# Patient Record
Sex: Female | Born: 1995 | Race: White | Hispanic: No | Marital: Single | Smoking: Former smoker
Health system: Southern US, Community
[De-identification: ages and names within clinical notes are randomized; demographics above are authoritative.]

## PROBLEM LIST (undated history)

## (undated) DIAGNOSIS — R55 Syncope and collapse: Secondary | ICD-10-CM

## (undated) DIAGNOSIS — R51 Headache: Secondary | ICD-10-CM

## (undated) DIAGNOSIS — Z309 Encounter for contraceptive management, unspecified: Secondary | ICD-10-CM

## (undated) DIAGNOSIS — IMO0002 Reserved for concepts with insufficient information to code with codable children: Secondary | ICD-10-CM

## (undated) DIAGNOSIS — Z3490 Encounter for supervision of normal pregnancy, unspecified, unspecified trimester: Secondary | ICD-10-CM

## (undated) DIAGNOSIS — G589 Mononeuropathy, unspecified: Secondary | ICD-10-CM

## (undated) DIAGNOSIS — I671 Cerebral aneurysm, nonruptured: Secondary | ICD-10-CM

## (undated) HISTORY — DX: Mononeuropathy, unspecified: G58.9

## (undated) HISTORY — DX: Encounter for contraceptive management, unspecified: Z30.9

## (undated) HISTORY — DX: Headache: R51

## (undated) HISTORY — PX: NO PAST SURGERIES: SHX2092

## (undated) HISTORY — DX: Encounter for supervision of normal pregnancy, unspecified, unspecified trimester: Z34.90

---
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
## 2014-03-22 ENCOUNTER — Encounter: Payer: Self-pay | Admitting: Women's Health

## 2014-03-22 ENCOUNTER — Ambulatory Visit (INDEPENDENT_AMBULATORY_CARE_PROVIDER_SITE_OTHER): Payer: Medicaid Other | Admitting: Women's Health

## 2014-03-22 VITALS — BP 112/64 | Ht 64.0 in | Wt 108.2 lb

## 2014-03-22 DIAGNOSIS — F17200 Nicotine dependence, unspecified, uncomplicated: Secondary | ICD-10-CM | POA: Insufficient documentation

## 2014-03-22 DIAGNOSIS — Z01419 Encounter for gynecological examination (general) (routine) without abnormal findings: Secondary | ICD-10-CM

## 2014-03-22 DIAGNOSIS — Z113 Encounter for screening for infections with a predominantly sexual mode of transmission: Secondary | ICD-10-CM

## 2014-03-22 DIAGNOSIS — R55 Syncope and collapse: Secondary | ICD-10-CM | POA: Insufficient documentation

## 2014-03-22 DIAGNOSIS — IMO0002 Reserved for concepts with insufficient information to code with codable children: Secondary | ICD-10-CM

## 2014-03-22 DIAGNOSIS — G935 Compression of brain: Secondary | ICD-10-CM | POA: Insufficient documentation

## 2014-03-22 DIAGNOSIS — I729 Aneurysm of unspecified site: Secondary | ICD-10-CM | POA: Insufficient documentation

## 2014-03-22 NOTE — Progress Notes (Signed)
Patient ID: Rachael Espinoza, female   DOB: 11/03/1996, 18 y.o.   MRN: [id number] Subjective:   Rachael CaraChelsea L Ungar is a 18 y.o. 761P1001 Caucasian female here for a routine well-woman exam.  Patient's last menstrual period was 03/08/2014.   H/O chiari malformation and carotid artery aneurysm, followed by [facility]- last seen Jan 2015, next visit Jan 2016 w/ possible surgery. Also has h/o vasovagal syncope, states she hasn't passed out in 5749yrs.  Current complaints: Nexplanon placed April 2014, has had amenorrhea until March, bled x 2 wks- reassured  PCP: Dr. Gerda DissLuking       Does desire STI labs  Social History: Sexual: heterosexual, mutually monogamous x 8243yrs Marital Status: dating Living situation: with family Occupation: [employer], Clinical biochemistcustomer service Tobacco/alcohol: tobacco: 1/4ppd, no etoh Illicit drugs: no history of illicit drug use  The following portions of the patient's history were reviewed and updated as appropriate: allergies, current medications, past family history, past medical history, past social history, past surgical history and problem list.  Past Medical History Past Medical History  Diagnosis Date  . Chiari malformation   . Vasovagal syncope   . Intracranial aneurysm   . NWGNFAOZ(308.6Headache(784.0)     Past Surgical History Past Surgical History  Procedure Laterality Date  . No past surgeries      Gynecologic History G1P1001  Patient's last menstrual period was 03/08/2014. Contraception: Nexplanon Last Pap: n/a. Results were: n/a Last mammogram: never. Results were: n/a Last TCS: never  Obstetric History OB History  Gravida Para Term Preterm AB SAB TAB Ectopic Multiple Living  1 1 1       1     # Outcome Date GA Lbr Len/2nd Weight Sex Delivery Anes PTL Lv  1 TRM 01/05/13 2727w4d 07:28 / 00:27 7 lb 14.3 oz (3.58 kg) F SVD None  Y      Current Medications Current Outpatient Prescriptions on File Prior to Visit  Medication Sig Dispense Refill  . ibuprofen (ADVIL,MOTRIN)  200 MG tablet Take 200 mg by mouth every 6 (six) hours as needed.       No current facility-administered medications on file prior to visit.    Review of Systems Patient denies any headaches, blurred vision, shortness of breath, chest pain, abdominal pain, problems with bowel movements, urination, or intercourse.  Objective:  BP 112/64  Ht 5\' 4"  (1.626 m)  Wt 108 lb 3.2 oz (49.079 kg)  BMI 18.56 kg/m2  LMP 03/08/2014  Breastfeeding? No Physical Exam  General:  Well developed, well nourished, no acute distress. She is alert and oriented x3. Skin:  Warm and dry Neck:  Midline trachea, no thyromegaly or nodules Cardiovascular: Regular rate and rhythm, no murmur heard Lungs:  Effort normal, all lung fields clear to auscultation bilaterally Breasts:  No dominant palpable mass, retraction, or nipple discharge Abdomen:  Soft, non tender, no hepatosplenomegaly or masses Pelvic:  Thin prep pap is not done <21yo  Extremities:  No swelling or varicosities noted Psych:  She has a normal mood and affect  Assessment:  Healthy well-woman exam STI screen Smoker Plan:  GC/CH from urine, HIV, RPR, Hep B&C, HSV2 today F/U 7130yr for Nexplanon removal and physical, or sooner if needed Mammogram @19yo  or sooner if problems Colonoscopy @19yo  or sooner if problems Pap @ 19yo Advised smoking cessation, offered QuitlineNC, declined  American FinancialKimberly Randall Danaysha Kirn CNM, [facility]NP-BC 03/22/2014 11:34 AM

## 2014-03-23 LAB — GC/CHLAMYDIA PROBE AMP
CT Probe RNA: NEGATIVE
GC PROBE AMP APTIMA: NEGATIVE

## 2014-03-23 LAB — RPR

## 2014-03-23 LAB — HEPATITIS C ANTIBODY: HCV AB: NEGATIVE

## 2014-03-23 LAB — HEPATITIS B SURFACE ANTIGEN: Hepatitis B Surface Ag: NEGATIVE

## 2014-03-23 LAB — HSV 2 ANTIBODY, IGG

## 2014-03-23 LAB — HIV ANTIBODY (ROUTINE TESTING W REFLEX): HIV 1&2 Ab, 4th Generation: NONREACTIVE

## 2014-08-25 ENCOUNTER — Encounter: Payer: Self-pay | Admitting: Nurse Practitioner

## 2014-08-25 ENCOUNTER — Ambulatory Visit (HOSPITAL_COMMUNITY)
Admission: RE | Admit: 2014-08-25 | Discharge: 2014-08-25 | Disposition: A | Discharge status: Home / Self Care | Payer: PRIVATE HEALTH INSURANCE | Source: Ambulatory Visit | Attending: Nurse Practitioner | Admitting: Nurse Practitioner

## 2014-08-25 ENCOUNTER — Encounter: Payer: Self-pay | Admitting: Adult Health

## 2014-08-25 ENCOUNTER — Ambulatory Visit (INDEPENDENT_AMBULATORY_CARE_PROVIDER_SITE_OTHER): Payer: PRIVATE HEALTH INSURANCE | Admitting: Adult Health

## 2014-08-25 ENCOUNTER — Ambulatory Visit (INDEPENDENT_AMBULATORY_CARE_PROVIDER_SITE_OTHER): Payer: PRIVATE HEALTH INSURANCE | Admitting: Nurse Practitioner

## 2014-08-25 VITALS — BP 108/74 | Ht 63.0 in | Wt 114.5 lb

## 2014-08-25 VITALS — BP 112/78 | Ht 64.0 in | Wt 114.4 lb

## 2014-08-25 DIAGNOSIS — R05 Cough: Secondary | ICD-10-CM | POA: Insufficient documentation

## 2014-08-25 DIAGNOSIS — R053 Chronic cough: Secondary | ICD-10-CM

## 2014-08-25 DIAGNOSIS — Z3202 Encounter for pregnancy test, result negative: Secondary | ICD-10-CM

## 2014-08-25 DIAGNOSIS — Z308 Encounter for other contraceptive management: Secondary | ICD-10-CM

## 2014-08-25 DIAGNOSIS — J309 Allergic rhinitis, unspecified: Secondary | ICD-10-CM

## 2014-08-25 DIAGNOSIS — Z309 Encounter for contraceptive management, unspecified: Secondary | ICD-10-CM

## 2014-08-25 DIAGNOSIS — Z77120 Contact with and (suspected) exposure to mold (toxic): Secondary | ICD-10-CM

## 2014-08-25 DIAGNOSIS — Z3046 Encounter for surveillance of implantable subdermal contraceptive: Secondary | ICD-10-CM

## 2014-08-25 DIAGNOSIS — Z30011 Encounter for initial prescription of contraceptive pills: Secondary | ICD-10-CM

## 2014-08-25 DIAGNOSIS — H1013 Acute atopic conjunctivitis, bilateral: Secondary | ICD-10-CM

## 2014-08-25 HISTORY — DX: Encounter for contraceptive management, unspecified: Z30.9

## 2014-08-25 LAB — POCT URINE PREGNANCY: Preg Test, Ur: NEGATIVE

## 2014-08-25 MED ORDER — NORETHINDRONE 0.35 MG PO TABS: 1.0000 | ORAL_TABLET | Freq: Every day | ORAL | Status: DC

## 2014-08-25 NOTE — Patient Instructions (Signed)
OTC antihistamine Nasacort AQ as directed Zaditor eye drops

## 2014-08-25 NOTE — Patient Instructions (Signed)
Use condoms, keep clean and dry x 24 hours, no heavy lifting, keep steri strips on x 72 hours, Keep pressure dressing on x 24 hours. Follow up prn problems.can start micronor now.follow up in 3 months

## 2014-08-25 NOTE — Progress Notes (Signed)
Subjective:     Patient ID: Rachael Espinoza, female   DOB: 12/21/1995, 18 y.o.   MRN: [id number]  HPI Rachael Espinoza is a 18 year old white female in for nexplanon removal, has had bleeding since March and wants it out.Has chiari malformation and aneurysm with vasovagal syncope.  Review of Systems See HPI Reviewed past medical,surgical, social and family history. Reviewed medications and allergies.     Objective:   Physical Exam BP 108/74  Ht 5\' 3"  (1.6 m)  Wt 114 lb 8 oz (51.937 kg)  BMI 20.29 kg/m2  LMP 08/15/2014  Breastfeeding? NoUPT negative verbal consent obtained, time out called,left arm cleansed with betadine, and injected with 1.5 cc 1% lidocaine and waited til numb.Under sterile technique a #11 blade was used to make small vertical incision, and a curved forceps was used to easily remove rod. Steri strips applied. Pressure dressing applied.She wanted nuva ring but only use POP with her problems.    Assessment:     Nexplanon removal Contraceptive management    Plan:     Rx micronor disp 1 pack take 1 daily with 11 refill Follow up in 3 months  Use condoms, keep clean and dry x 24 hours, no heavy lifting, keep steri strips on x 72 hours, Keep pressure dressing on x 24 hours. Follow up prn problems.   Try to stop smoking

## 2014-08-26 ENCOUNTER — Encounter: Payer: Self-pay | Admitting: Nurse Practitioner

## 2014-08-26 LAB — GC/CHLAMYDIA PROBE AMP
CT PROBE, AMP APTIMA: NEGATIVE
GC Probe RNA: NEGATIVE

## 2014-08-26 NOTE — Progress Notes (Signed)
Subjective:  Presents to discuss exposure to black mold at her workplace. Began working in this environment from January 2015 until last week. According to patient her employer was aware of issue for several years. Closet at work had visible black mold. Employer cleaned off the area and called it "mildew". Mold would grow back. Also at a vent, there was about 6 feet of leakage of water with mold growing in this area. Health department had visited the site several times. Closed down the area last week. Patient's headaches had been completely resolved but came back in April. Has developed a daily cough which is occasionally productive. Runny nose. Wheezing at times. Slight head congestion. Frequent watery eyes. Occasional sore throat. No ear pain. No fevers. Denies acid reflux or abdominal pain. History of smoking one pack per week but has stopped this and switched to evapes.  Objective:   BP 112/78  Ht 5\' 4"  (1.626 m)  Wt 114 lb 6.4 oz (51.891 kg)  BMI 19.63 kg/m2  LMP 08/15/2014 NAD. Alert, oriented. Conjunctiva clear. TMs mild clear effusion, no erythema. Nasal mucosa pink and mildly boggy. Pharynx injected with clear PND noted. Neck supple with mild soft anterior adenopathy. Lungs clear. Heart regular rate rhythm.  Assessment: Allergic rhinitis, unspecified allergic rhinitis type  Allergic conjunctivitis, bilateral  Persistent cough - Plan: DG Chest 2 View  Mold exposure  Plan: Chest x-ray pending. OTC meds as directed. OTC antihistamine Nasacort AQ as directed Zaditor eye drops Call back in 2 weeks if symptoms persist, recommend referral to allergy specialist for evaluation at that time.

## 2014-09-12 ENCOUNTER — Encounter: Payer: Self-pay | Admitting: Nurse Practitioner

## 2014-11-25 ENCOUNTER — Ambulatory Visit (INDEPENDENT_AMBULATORY_CARE_PROVIDER_SITE_OTHER): Payer: PRIVATE HEALTH INSURANCE | Admitting: Adult Health

## 2014-11-25 ENCOUNTER — Encounter: Payer: Self-pay | Admitting: Adult Health

## 2014-11-25 VITALS — BP 110/60 | Ht 63.0 in | Wt 112.5 lb

## 2014-11-25 DIAGNOSIS — Z3041 Encounter for surveillance of contraceptive pills: Secondary | ICD-10-CM

## 2014-11-25 NOTE — Progress Notes (Signed)
Subjective:     Patient ID: Rachael Espinoza, female   DOB: 03/05/1996, 19 y.o.   MRN: [id number]  HPI Rachael Espinoza is a 19 year old white female in for follow up of starting micronor in October after nexplanon removal, had ?BTB once, no complaints.  Review of Systems See HPI Reviewed past medical,surgical, social and family history. Reviewed medications and allergies.     Objective:   Physical Exam BP 110/60 mmHg  Ht 5\' 3"  (1.6 m)  Wt 112 lb 8 oz (51.03 kg)  BMI 19.93 kg/m2  LMP 11/11/2014  Breastfeeding? No   Talk only doing good with micronor  Assessment:     Contraceptive management    Plan:     Continue micronor Use condoms Follow up in October

## 2014-11-25 NOTE — Patient Instructions (Signed)
Continue OCs Use condoms Follow up in October

## 2015-01-30 ENCOUNTER — Other Ambulatory Visit: Payer: Self-pay | Admitting: Neurosurgery

## 2015-01-30 DIAGNOSIS — G935 Compression of brain: Secondary | ICD-10-CM

## 2015-02-10 ENCOUNTER — Ambulatory Visit
Admission: RE | Admit: 2015-02-10 | Discharge: 2015-02-10 | Disposition: A | Discharge status: Home / Self Care | Payer: PRIVATE HEALTH INSURANCE | Source: Ambulatory Visit | Attending: Neurosurgery | Admitting: Neurosurgery

## 2015-02-10 DIAGNOSIS — G935 Compression of brain: Secondary | ICD-10-CM

## 2015-02-10 MED ORDER — GADOBENATE DIMEGLUMINE 529 MG/ML IV SOLN
10.0000 mL | Freq: Once | INTRAVENOUS | Status: AC | PRN
  Administered 2015-02-10: 10 mL via INTRAVENOUS

## 2015-08-02 ENCOUNTER — Encounter: Payer: Self-pay | Admitting: Family Medicine

## 2015-08-02 ENCOUNTER — Ambulatory Visit (INDEPENDENT_AMBULATORY_CARE_PROVIDER_SITE_OTHER): Payer: PRIVATE HEALTH INSURANCE | Admitting: Family Medicine

## 2015-08-02 VITALS — Temp 98.4°F | Ht 64.0 in | Wt 113.4 lb

## 2015-08-02 DIAGNOSIS — J329 Chronic sinusitis, unspecified: Secondary | ICD-10-CM

## 2015-08-02 DIAGNOSIS — J209 Acute bronchitis, unspecified: Secondary | ICD-10-CM

## 2015-08-02 MED ORDER — AZITHROMYCIN 250 MG PO TABS: ORAL_TABLET | ORAL | Status: DC

## 2015-08-02 MED ORDER — BENZONATATE 100 MG PO CAPS: 100.0000 mg | ORAL_CAPSULE | Freq: Three times a day (TID) | ORAL | Status: DC | PRN

## 2015-08-02 NOTE — Progress Notes (Signed)
   Subjective:    Patient ID: Rachael Espinoza, female    DOB: 1996-01-17, 19 y.o.   MRN: [id number]  Cough This is a new problem. The current episode started in the past 7 days. Associated symptoms include nasal congestion.   Cong and drainage and cough  Some prod phlegm and cong and gunkiness  dayquil and not helping  Some wheeziness  No vomiting or diarrhea  Review of Systems  Respiratory: Positive for cough.    Frontal headache    Objective:   Physical Exam  Alert vitals stable afebrile. HEENT moderate nasal congestion frontal tenderness pharynx normal neck supple. Lungs clear. Heart regular in rhythm      Assessment & Plan:  No vomiting or diarrhea impression acute rhinosinusitis with element of bronchitis plan antibiotics prescribed. Tessalon when necessary. Stop smoking. WSL

## 2016-01-19 ENCOUNTER — Ambulatory Visit (INDEPENDENT_AMBULATORY_CARE_PROVIDER_SITE_OTHER): Payer: Managed Care, Other (non HMO) | Admitting: Family Medicine

## 2016-01-19 ENCOUNTER — Encounter: Payer: Self-pay | Admitting: Family Medicine

## 2016-01-19 VITALS — BP 100/70 | Temp 98.2°F | Ht 64.0 in | Wt 118.0 lb

## 2016-01-19 DIAGNOSIS — R040 Epistaxis: Secondary | ICD-10-CM | POA: Diagnosis not present

## 2016-01-19 NOTE — Progress Notes (Signed)
   Subjective:    Patient ID: Rachael Espinoza, female    DOB: 02/24/1996, 19 y.o.   MRN: [id number]  Sinusitis This is a new problem. The current episode started yesterday. The problem is unchanged. There has been no fever. (Bloody drainage) Past treatments include nothing. The treatment provided no relief.   This patient noted some blood going down the back of her throat. She also noted some blood coming in some mucus. She was concerned that it could be her aneurysm. Severe therefore she came to be seen. She denied any other underlying issue.   Review of Systems Denies fevers chills cough wheezing vomiting diarrhea    Objective:   Physical Exam Neck no masses sinuses nontender throat appears normal. Eardrums normal. Patient does not be in distress. [facility] Dr. Jayme CloudGonzalez was called. I spoke with his assistance Dr. Vic BlackbirdAndrew Cutler who spoke with Dr. Jayme CloudGonzalez who called me back. Dr. Marolyn Hallerutler related that they did not feel the patient's aneurysm was causing her problem but they will get her in next week to be seen.    Assessment & Plan:  Viral syndrome No obvious sign of a bacterial infection No antibiotics indicated I do not find evidence that this is bleeding from her aneurysm. Her [facility] near future this coming week for reevaluation patient was told use saline nasal sprays if heavy bleeding go to ER

## 2016-05-29 IMAGING — MR MR HEAD WO/W CM
12 of 13 series · 39 of 48 positions shown · IV contrast (10ml multihance)
Comparison: 11/15/2010

CLINICAL DATA: Followup aneurysm found at age 14. Follow-up Chiari
malformation.

EXAM:
MRI HEAD WITHOUT AND WITH CONTRAST
MRA HEAD WITHOUT CONTRAST
TECHNIQUE: Multiplanar, multiecho pulse sequences of the brain and surrounding
structures were obtained without and with intravenous contrast.
Angiographic images of the head were obtained using MRA technique
without contrast.
CONTRAST:  10mL MULTIHANCE GADOBENATE DIMEGLUMINE 529 MG/ML IV SOLN

[Series 3: tof_3d_multi-slab · axial · 0.7mm · 0.35mm/px · z∈[-51,+61]mm · 8 of 162 slices shown]
[im 1/162]
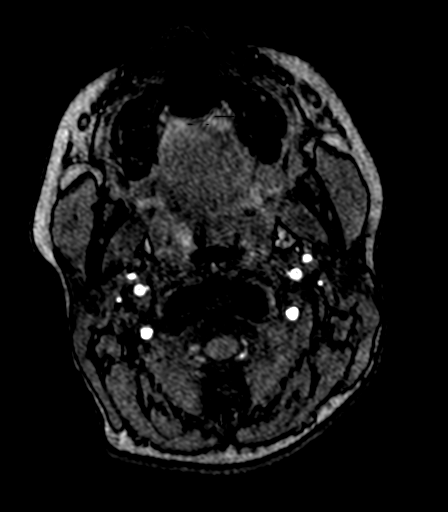
[im 33/162]
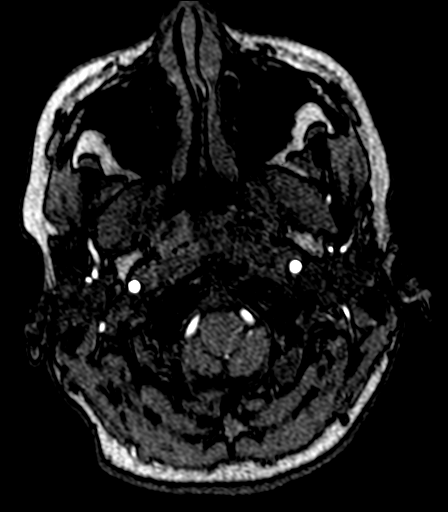
[im 49/162]
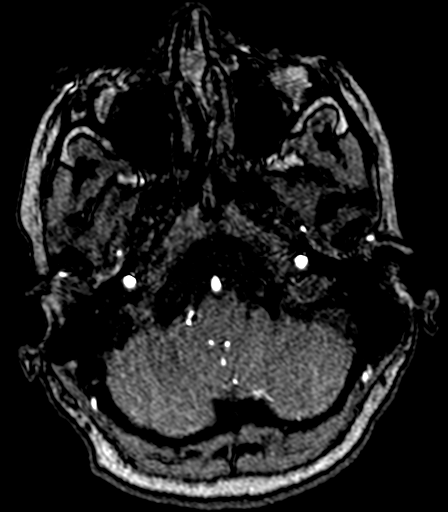
[im 65/162]
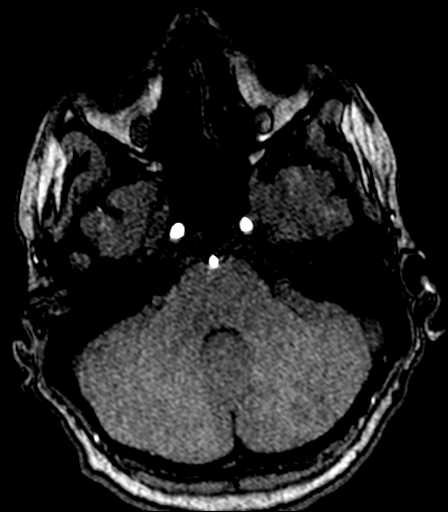
[im 97/162]
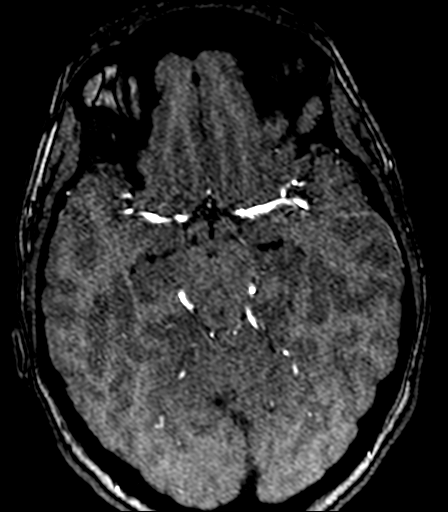
[im 113/162]
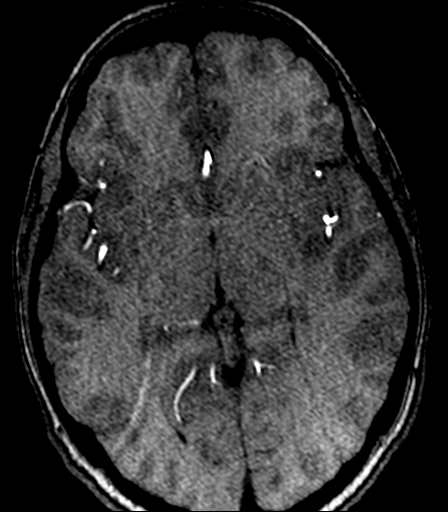
[im 129/162]
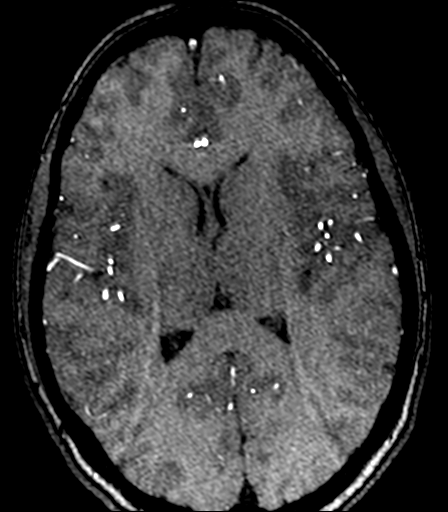
[im 162/162]
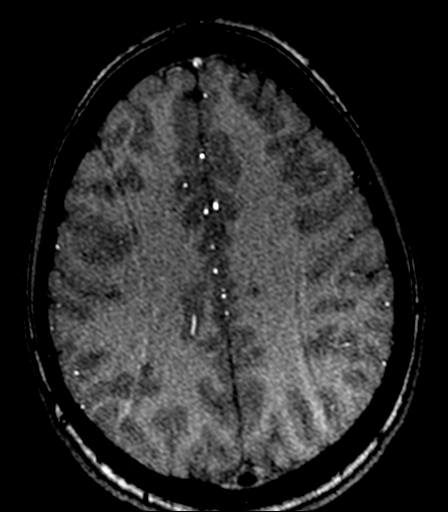

[Series 7: T1 · sagittal · 5.0mm · 0.45mm/px · 1 of 22 slices shown]
[im 1/22]
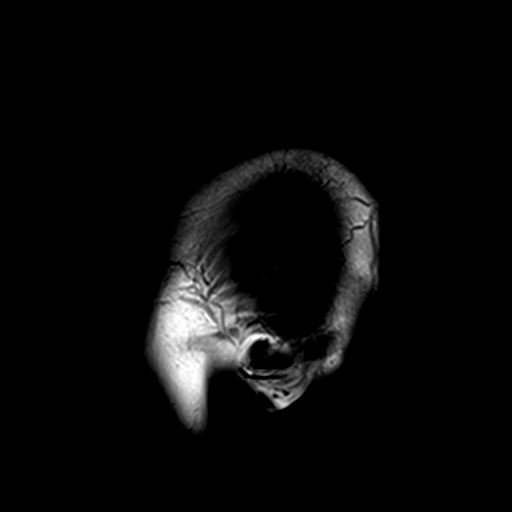

[Series 11: DWI · axial · 3.0mm · 1.80mm/px · z∈[-45,+102]mm · 6 of 100 slices shown (1 of 4)]
[im 1/100]
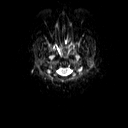
[im 20/100]
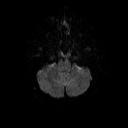
[im 40/100]
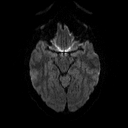
[im 60/100]
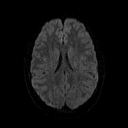
[im 80/100]
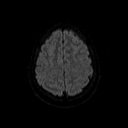
[im 100/100]
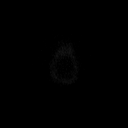

[Series 12: DWI · axial · 3.0mm · 1.80mm/px · z∈[-45,+102]mm · 3 of 50 slices shown (2 of 4)]
[im 1/50]
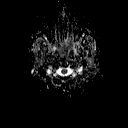
[im 25/50]
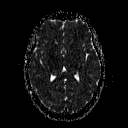
[im 50/50]
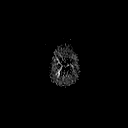

[Series 18: swi_images · axial · 2.0mm · 0.90mm/px · z∈[-50,+108]mm · 5 of 80 slices shown]
[im 1/80]
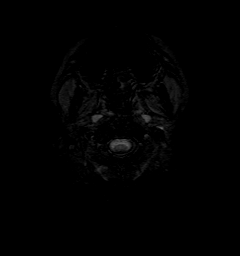
[im 20/80]
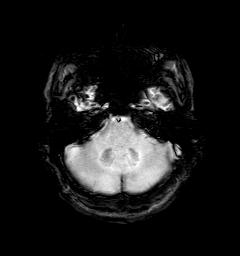
[im 40/80]
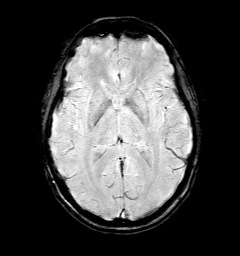
[im 60/80]
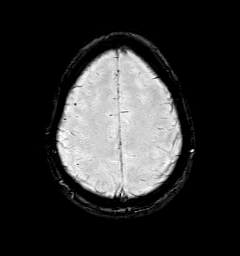
[im 80/80]
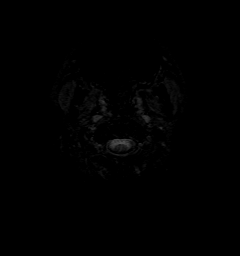

[Series 19: DWI · coronal · 5.0mm · 1.80mm/px · 4 of 69 slices shown (3 of 4)]
[im 1/69]
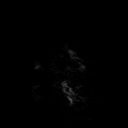
[im 23/69]
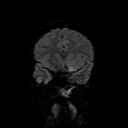
[im 46/69]
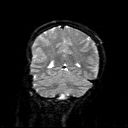
[im 69/69]
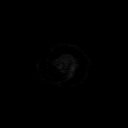

[Series 20: DWI · coronal · 5.0mm · 1.80mm/px · 2 of 35 slices shown (4 of 4)]
[im 1/35]
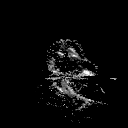
[im 35/35]
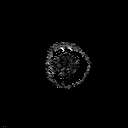

[Series 21: T2 · axial · 5.0mm · 0.51mm/px · 1 of 24 slices shown (1 of 2)]
[im 1/24]
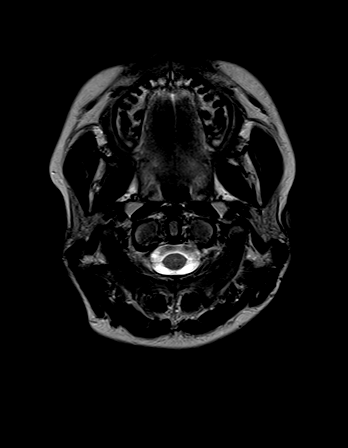

[Series 22: FLAIR · axial · 5.0mm · 0.45mm/px · 1 of 24 slices shown]
[im 1/24]
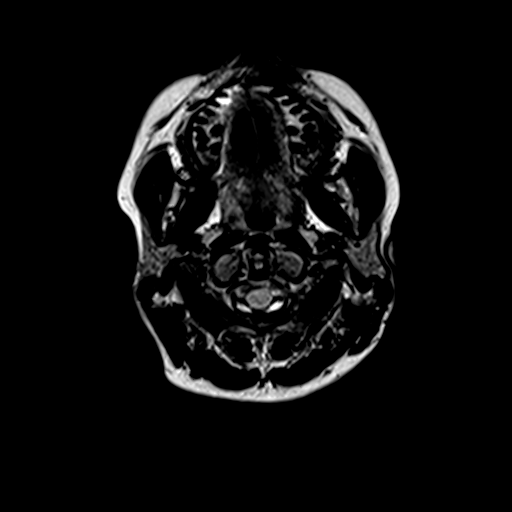

[Series 23: t1_mpr_tra · axial · 2.0mm · 0.45mm/px · z∈[-54,+104]mm · 5 of 80 slices shown (1 of 2)]
[im 1/80]
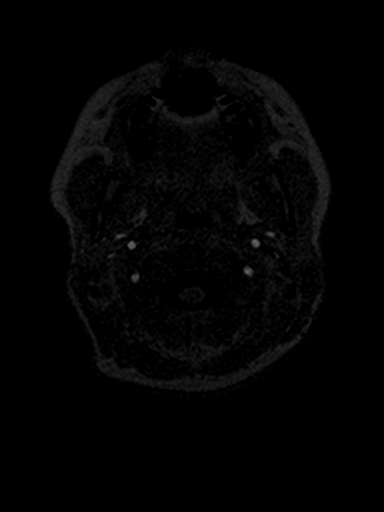
[im 20/80]
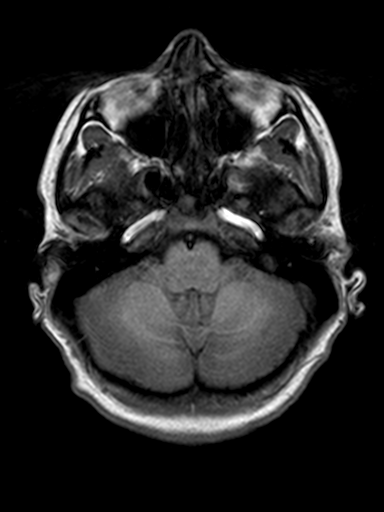
[im 40/80]
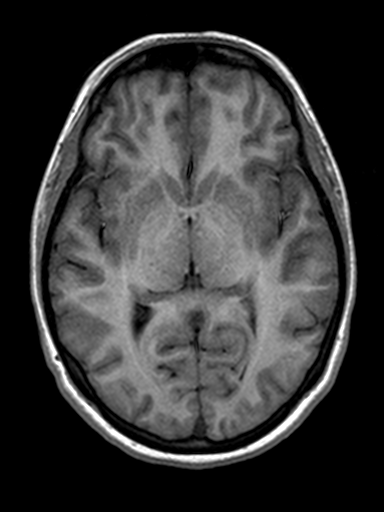
[im 60/80]
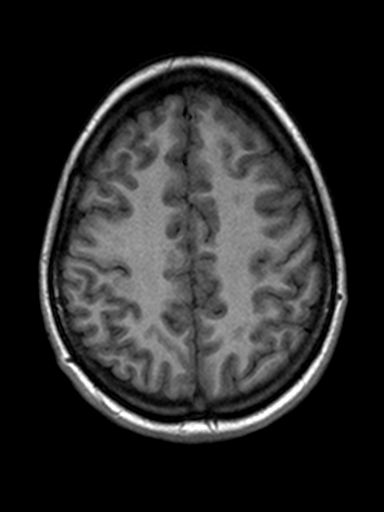
[im 80/80]
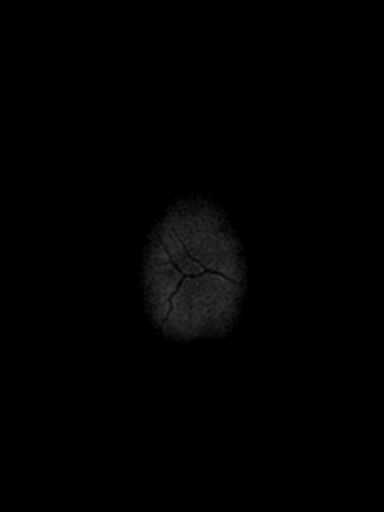

[Series 24: T2 · coronal · 5.0mm · 0.45mm/px · 2 of 27 slices shown (2 of 2)]
[im 1/27]
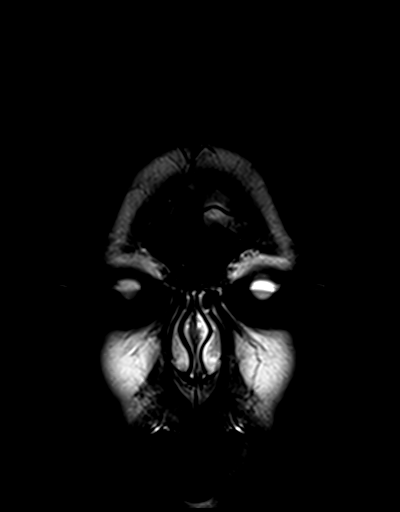
[im 27/27]
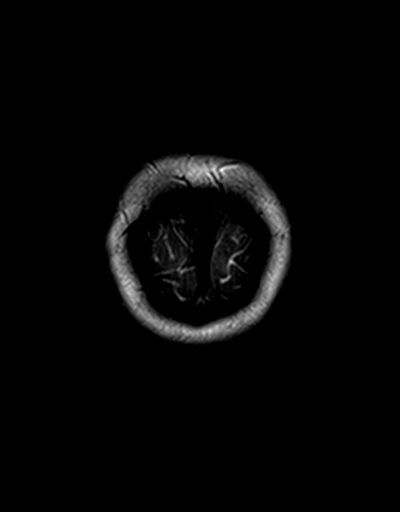

[Series 25: t1_mpr_tra · axial · 2.0mm · 0.45mm/px · 1 of 80 slices shown (2 of 2)]
[im 1/80]
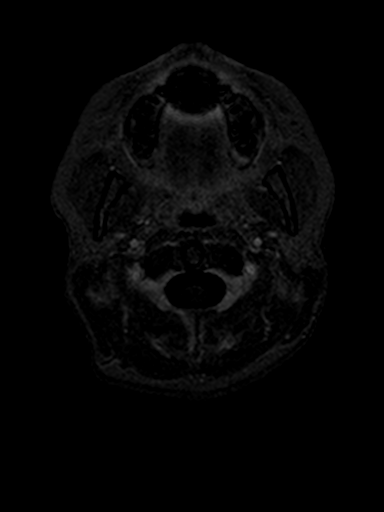

[39 of 48 positions shown; findings below may reference images not displayed]

FINDINGS: MRI HEAD FINDINGS

Again there is cerebellar tonsillar extension through the foramen
magnum of 9 mm consistent with Chiari malformation. The upper
cervical cord appears normal. Otherwise, the brain itself appears
normal. No evidence of atrophy, old or acute infarction, mass
lesion, hemorrhage, hydrocephalus or extra-axial collection. After
contrast administration, no abnormal brain enhancement occurs.

MRA HEAD FINDINGS

Both internal carotid arteries are widely patent through the
skullbase. There is aneurysmal dilatation at the proximal portion of
the cavernous carotid, with the limb an measured maximally today at
9 x 10.7 mm. Previously this measured 6.8 x 9.7 mm. This shows
enlargement. Beyond that, the supraclinoid internal carotid artery
is normal. No ICA abnormality is seen on the left. The anterior and
middle cerebral vessels are normal.

Both vertebral arteries are widely patent to the basilar. No basilar
stenosis. Posterior circulation branch vessels are normal. Patent
posterior communicating arteries bilaterally.
IMPRESSION: Chiari malformation with cerebellar tonsillar herniation through the
foramen magnum of 9 mm. Otherwise normal appearance of the brain
itself.

Aneurysm arising from the proximal cavernous carotid on the right,
apparently having enlarged since the previous study. Previous
measurements were 6.8 x 9.7 mm. Today this measures 9 x 10.7 mm.
This is a worrisome trend and should be considered in the setting of
potential treatments.

## 2016-07-08 ENCOUNTER — Encounter: Payer: Managed Care, Other (non HMO) | Admitting: Adult Health

## 2016-07-25 ENCOUNTER — Other Ambulatory Visit: Payer: Self-pay | Admitting: Obstetrics and Gynecology

## 2016-07-25 DIAGNOSIS — O3680X0 Pregnancy with inconclusive fetal viability, not applicable or unspecified: Secondary | ICD-10-CM

## 2016-07-29 ENCOUNTER — Ambulatory Visit (INDEPENDENT_AMBULATORY_CARE_PROVIDER_SITE_OTHER): Payer: Medicaid Other

## 2016-07-29 DIAGNOSIS — O3680X0 Pregnancy with inconclusive fetal viability, not applicable or unspecified: Secondary | ICD-10-CM | POA: Diagnosis not present

## 2016-07-29 DIAGNOSIS — Z3A08 8 weeks gestation of pregnancy: Secondary | ICD-10-CM

## 2016-07-29 NOTE — Progress Notes (Signed)
US 7+6 wks,single IUP w/ys pos fht 176 bpm,normal ov's bilat,crl 15.5 mm

## 2016-07-30 ENCOUNTER — Other Ambulatory Visit: Payer: Managed Care, Other (non HMO)

## 2016-08-13 ENCOUNTER — Encounter: Payer: Self-pay | Admitting: Adult Health

## 2016-08-13 ENCOUNTER — Ambulatory Visit (INDEPENDENT_AMBULATORY_CARE_PROVIDER_SITE_OTHER): Payer: Medicaid Other | Admitting: Adult Health

## 2016-08-13 ENCOUNTER — Other Ambulatory Visit (HOSPITAL_COMMUNITY)
Admission: RE | Admit: 2016-08-13 | Discharge: 2016-08-13 | Disposition: A | Discharge status: Home / Self Care | Payer: Medicaid Other | Source: Ambulatory Visit | Attending: Adult Health | Admitting: Adult Health

## 2016-08-13 VITALS — BP 110/60 | HR 92 | Wt 128.5 lb

## 2016-08-13 DIAGNOSIS — Z3401 Encounter for supervision of normal first pregnancy, first trimester: Secondary | ICD-10-CM

## 2016-08-13 DIAGNOSIS — Z331 Pregnant state, incidental: Secondary | ICD-10-CM

## 2016-08-13 DIAGNOSIS — I729 Aneurysm of unspecified site: Secondary | ICD-10-CM

## 2016-08-13 DIAGNOSIS — Z01419 Encounter for gynecological examination (general) (routine) without abnormal findings: Secondary | ICD-10-CM | POA: Diagnosis not present

## 2016-08-13 DIAGNOSIS — Z1389 Encounter for screening for other disorder: Secondary | ICD-10-CM

## 2016-08-13 DIAGNOSIS — Z0283 Encounter for blood-alcohol and blood-drug test: Secondary | ICD-10-CM

## 2016-08-13 DIAGNOSIS — Z113 Encounter for screening for infections with a predominantly sexual mode of transmission: Secondary | ICD-10-CM | POA: Insufficient documentation

## 2016-08-13 DIAGNOSIS — Z3682 Encounter for antenatal screening for nuchal translucency: Secondary | ICD-10-CM

## 2016-08-13 DIAGNOSIS — Z3490 Encounter for supervision of normal pregnancy, unspecified, unspecified trimester: Secondary | ICD-10-CM

## 2016-08-13 DIAGNOSIS — Z3481 Encounter for supervision of other normal pregnancy, first trimester: Secondary | ICD-10-CM

## 2016-08-13 DIAGNOSIS — Z369 Encounter for antenatal screening, unspecified: Secondary | ICD-10-CM

## 2016-08-13 DIAGNOSIS — O99351 Diseases of the nervous system complicating pregnancy, first trimester: Secondary | ICD-10-CM

## 2016-08-13 DIAGNOSIS — Z8669 Personal history of other diseases of the nervous system and sense organs: Secondary | ICD-10-CM

## 2016-08-13 DIAGNOSIS — Z3A10 10 weeks gestation of pregnancy: Secondary | ICD-10-CM

## 2016-08-13 HISTORY — DX: Encounter for supervision of normal pregnancy, unspecified, unspecified trimester: Z34.90

## 2016-08-13 LAB — POCT URINALYSIS DIPSTICK
Blood, UA: NEGATIVE
GLUCOSE UA: NEGATIVE
Ketones, UA: NEGATIVE
Leukocytes, UA: NEGATIVE
Nitrite, UA: NEGATIVE
Protein, UA: NEGATIVE

## 2016-08-13 NOTE — Progress Notes (Signed)
Subjective:  Rachael Espinoza is a 20 y.o. 662P1001 Caucasian female at 464w0d by LMP and US being seen today for her first obstetrical visit.  Her obstetrical history is significant for has chiari malformation and aneursym behind right eye.  Pregnancy history fully reviewed.  Patient reports backache, and relux, can take TUMS and OK to see chiropractor.  Denies vb, cramping, uti s/s, abnormal/malodorous vag d/c, or vulvovaginal itching/irritation.  BP 110/60   Pulse 92   Wt 128 lb 8 oz (58.3 kg)   LMP 06/04/2016 (Exact Date)   BMI 22.06 kg/m   HISTORY: OB History  Gravida Para Term Preterm AB Living  2 1 1     1   SAB TAB Ectopic Multiple Live Births          1    # Outcome Date GA Lbr Len/2nd Weight Sex Delivery Anes PTL Lv  2 Current           1 Term 01/05/13 694w4d 07:28 / 00:27 7 lb 14.3 oz (3.58 kg) F Vag-Spont None  LIV     Past Medical History:  Diagnosis Date  . Chiari malformation   . Contraceptive management 08/25/2014  . Headache(784.0)   . Intracranial aneurysm   . Pinched nerve   . Vasovagal syncope    Past Surgical History:  Procedure Laterality Date  . NO PAST SURGERIES     Family History  Problem Relation Age of Onset  . Other Maternal Grandmother     cushing's  . Cancer Father     lung  . Hypertension Mother   . Thyroid disease Mother   . Cancer Maternal Aunt     cervical cancer  . Cancer Other     cervical; maternal great grandma  . Heart disease Other   . Cancer Maternal Grandfather     skin  . Cancer Other     renal cell    Exam   System:     General: Well developed & nourished, no acute distress   Skin: Warm & dry, normal coloration and turgor, no rashes   Neurologic: Alert & oriented, normal mood   Cardiovascular: Regular rate & rhythm   Respiratory: Effort & rate normal, LCTAB, acyanotic   Abdomen: Soft, non tender   Extremities: normal strength, tone   Pelvic Exam:    Perineum: Normal perineum   Vulva: Normal, no lesions   Vagina:  Normal mucosa, normal discharge   Cervix: Normal, bulbous, appears closed   Uterus: Normal size/shape/contour for GA   Thin prep pap smear performed today FHR: 180  via US    Assessment:   Pregnancy: G2P1001 Patient Active Problem List   Diagnosis Date Noted  . Contraceptive management 08/25/2014  . Aneurysm (HCC) 03/22/2014  . Chiari malformation 03/22/2014  . Vasovagal syncope 03/22/2014  . Smoker 03/22/2014    764w0d G2P1001 New OB visit     Plan:  Initial labs drawn Continue prenatal vitamins Problem list reviewed and updated Reviewed n/v relief measures and warning s/s to report Reviewed recommended weight gain based on pre-gravid BMI Encouraged well-balanced diet Genetic Screening discussed Integrated Screen: requested Cystic fibrosis screening discussed requested Ultrasound discussed; fetal survey: requested Follow up in 2 weeks for IT/NT and see provider  PHQ 2 score 0.  Adline PotterJennifer A. Dessirae Scarola, NP 08/13/2016 11:26 AM

## 2016-08-13 NOTE — Patient Instructions (Signed)

## 2016-08-14 LAB — CYTOLOGY - PAP

## 2016-08-15 LAB — URINE CULTURE

## 2016-08-20 LAB — RPR: RPR Ser Ql: NONREACTIVE

## 2016-08-20 LAB — URINALYSIS, ROUTINE W REFLEX MICROSCOPIC
Bilirubin, UA: NEGATIVE
GLUCOSE, UA: NEGATIVE
Ketones, UA: NEGATIVE
Leukocytes, UA: NEGATIVE
Nitrite, UA: NEGATIVE
PROTEIN UA: NEGATIVE
RBC, UA: NEGATIVE
Specific Gravity, UA: 1.006 (ref 1.005–1.030)
UUROB: 0.2 mg/dL (ref 0.2–1.0)
pH, UA: 6.5 (ref 5.0–7.5)

## 2016-08-20 LAB — CBC
Hematocrit: 37.8 % (ref 34.0–46.6)
Hemoglobin: 13.2 g/dL (ref 11.1–15.9)
MCH: 32.8 pg (ref 26.6–33.0)
MCHC: 34.9 g/dL (ref 31.5–35.7)
MCV: 94 fL (ref 79–97)
PLATELETS: 250 10*3/uL (ref 150–379)
RBC: 4.03 x10E6/uL (ref 3.77–5.28)
RDW: 12.3 % (ref 12.3–15.4)
WBC: 11.3 10*3/uL — ABNORMAL HIGH (ref 3.4–10.8)

## 2016-08-20 LAB — PMP SCREEN PROFILE (10S), URINE
Amphetamine Screen, Ur: NEGATIVE ng/mL
BARBITURATE SCRN UR: NEGATIVE ng/mL
Benzodiazepine Screen, Urine: NEGATIVE ng/mL
CREATININE(CRT), U: 20.3 mg/dL (ref 20.0–300.0)
Cannabinoids Ur Ql Scn: NEGATIVE ng/mL
Cocaine(Metab.)Screen, Urine: NEGATIVE ng/mL
METHADONE SCREEN, URINE: NEGATIVE ng/mL
OPIATE SCRN UR: NEGATIVE ng/mL
OXYCODONE+OXYMORPHONE UR QL SCN: NEGATIVE ng/mL
PCP Scrn, Ur: NEGATIVE ng/mL
PROPOXYPHENE SCREEN: NEGATIVE ng/mL
Ph of Urine: 6.3 (ref 4.5–8.9)

## 2016-08-20 LAB — ANTIBODY SCREEN: ANTIBODY SCREEN: NEGATIVE

## 2016-08-20 LAB — RUBELLA SCREEN: RUBELLA: 1.11 {index} (ref >0.99)

## 2016-08-20 LAB — SICKLE CELL SCREEN: Sickle Cell Screen: NEGATIVE

## 2016-08-20 LAB — ABO/RH: RH TYPE: NEGATIVE

## 2016-08-20 LAB — HEPATITIS B SURFACE ANTIGEN: Hepatitis B Surface Ag: NEGATIVE

## 2016-08-20 LAB — CYSTIC FIBROSIS MUTATION 97: Interpretation: NOT DETECTED

## 2016-08-20 LAB — VARICELLA ZOSTER ANTIBODY, IGG: Varicella zoster IgG: 301 index (ref >165)

## 2016-08-20 LAB — HIV ANTIBODY (ROUTINE TESTING W REFLEX): HIV SCREEN 4TH GENERATION: NONREACTIVE

## 2016-08-27 ENCOUNTER — Ambulatory Visit (INDEPENDENT_AMBULATORY_CARE_PROVIDER_SITE_OTHER): Payer: Medicaid Other

## 2016-08-27 ENCOUNTER — Ambulatory Visit (INDEPENDENT_AMBULATORY_CARE_PROVIDER_SITE_OTHER): Payer: Medicaid Other | Admitting: Women's Health

## 2016-08-27 ENCOUNTER — Encounter: Payer: Self-pay | Admitting: Women's Health

## 2016-08-27 VITALS — BP 102/60 | HR 80 | Wt 129.0 lb

## 2016-08-27 DIAGNOSIS — Z6791 Unspecified blood type, Rh negative: Secondary | ICD-10-CM | POA: Insufficient documentation

## 2016-08-27 DIAGNOSIS — Z3481 Encounter for supervision of other normal pregnancy, first trimester: Secondary | ICD-10-CM

## 2016-08-27 DIAGNOSIS — Z1389 Encounter for screening for other disorder: Secondary | ICD-10-CM

## 2016-08-27 DIAGNOSIS — Z3A12 12 weeks gestation of pregnancy: Secondary | ICD-10-CM | POA: Diagnosis not present

## 2016-08-27 DIAGNOSIS — R55 Syncope and collapse: Secondary | ICD-10-CM

## 2016-08-27 DIAGNOSIS — Z3682 Encounter for antenatal screening for nuchal translucency: Secondary | ICD-10-CM

## 2016-08-27 DIAGNOSIS — F17200 Nicotine dependence, unspecified, uncomplicated: Secondary | ICD-10-CM

## 2016-08-27 DIAGNOSIS — O26899 Other specified pregnancy related conditions, unspecified trimester: Secondary | ICD-10-CM | POA: Insufficient documentation

## 2016-08-27 DIAGNOSIS — Z331 Pregnant state, incidental: Secondary | ICD-10-CM

## 2016-08-27 DIAGNOSIS — I729 Aneurysm of unspecified site: Secondary | ICD-10-CM

## 2016-08-27 DIAGNOSIS — G935 Compression of brain: Secondary | ICD-10-CM

## 2016-08-27 LAB — POCT URINALYSIS DIPSTICK
Blood, UA: NEGATIVE
Glucose, UA: NEGATIVE
KETONES UA: NEGATIVE
LEUKOCYTES UA: NEGATIVE
Nitrite, UA: NEGATIVE
Protein, UA: NEGATIVE

## 2016-08-27 NOTE — Progress Notes (Signed)
US 12 wks,measurements c/w dates,normal ov's bilat NB present,NT 1.2 mm,fhr 173 bpm,crl 57.2 mm,post pl gr 0

## 2016-08-27 NOTE — Progress Notes (Signed)
Low-risk OB appointment G2P1001 2687w0d Estimated Date of Delivery: 03/11/17 BP 102/60   Pulse 80   Wt 129 lb (58.5 kg)   LMP 06/04/2016 (Exact Date)   BMI 22.14 kg/m   BP, weight, and urine reviewed.  Refer to obstetrical flow sheet for FH & FHR.  Reports good fm.  Denies regular uc's, lof, vb, or uti s/s. No complaints. Has chiari malformation and carotid aneurysm dx at 20yo, last scan 02/2015- chiari malformation w/ cerebellar tonsillar herniation through formaen magnum of 9mm, aneurysm arises from proximal cavernous Rt carotid (actually is behind Rt eye) slightly enlarged from last scan- was 6.8x9.157mm-then measured 9x10.717mm. States this is exact measurement it was at [facility] said she would eventually need surgery- but not yet. Is supposed to have yearly scans and visits, couldn't go in April d/t no insurance. Only has pregnancy mcaid now, so not sure if it will cover scan and f/u w/ neuro. Discussed w/ [facility], needs repeat scan now. Will have Dawn/Angie check to see if covered by pregnancy Mcaid. Pt to check to see if she can get regular Mcaid.  No current sx, denies dizziness/lightheadedness/paresthesias. Had syncopal episode when 20yo and that's when was dx. No syncopal episodes since.  Reviewed today's normal nt u/s, warning s/s to report. Plan:  Continue routine obstetrical care  F/U in 4wks for OB appointment and 2nd IT 1st IT/NT today, declines flu shot

## 2016-08-27 NOTE — Patient Instructions (Addendum)
Check into getting switched to regular medicaid as soon as you can You will need to have another scan of your brain/head and see neurologist  Second Trimester of Pregnancy The second trimester is from week 13 through week 28, months 4 through 6. The second trimester is often a time when you feel your best. Your body has also adjusted to being pregnant, and you begin to feel better physically. Usually, morning sickness has lessened or quit completely, you may have more energy, and you may have an increase in appetite. The second trimester is also a time when the fetus is growing rapidly. At the end of the sixth month, the fetus is about 9 inches long and weighs about 1 pounds. You will likely begin to feel the baby move (quickening) between 18 and 20 weeks of the pregnancy. BODY CHANGES Your body goes through many changes during pregnancy. The changes vary from woman to woman.   Your weight will continue to increase. You will notice your lower abdomen bulging out.  You may begin to get stretch marks on your hips, abdomen, and breasts.  You may develop headaches that can be relieved by medicines approved by your health care provider.  You may urinate more often because the fetus is pressing on your bladder.  You may develop or continue to have heartburn as a result of your pregnancy.  You may develop constipation because certain hormones are causing the muscles that push waste through your intestines to slow down.  You may develop hemorrhoids or swollen, bulging veins (varicose veins).  You may have back pain because of the weight gain and pregnancy hormones relaxing your joints between the bones in your pelvis and as a result of a shift in weight and the muscles that support your balance.  Your breasts will continue to grow and be tender.  Your gums may bleed and may be sensitive to brushing and flossing.  Dark spots or blotches (chloasma, mask of pregnancy) may develop on your face. This  will likely fade after the baby is born.  A dark line from your belly button to the pubic area (linea nigra) may appear. This will likely fade after the baby is born.  You may have changes in your hair. These can include thickening of your hair, rapid growth, and changes in texture. Some women also have hair loss during or after pregnancy, or hair that feels dry or thin. Your hair will most likely return to normal after your baby is born. WHAT TO EXPECT AT YOUR PRENATAL VISITS During a routine prenatal visit:  You will be weighed to make sure you and the fetus are growing normally.  Your blood pressure will be taken.  Your abdomen will be measured to track your baby's growth.  The fetal heartbeat will be listened to.  Any test results from the previous visit will be discussed. Your health care provider may ask you:  How you are feeling.  If you are feeling the baby move.  If you have had any abnormal symptoms, such as leaking fluid, bleeding, severe headaches, or abdominal cramping.  If you are using any tobacco products, including cigarettes, chewing tobacco, and electronic cigarettes.  If you have any questions. Other tests that may be performed during your second trimester include:  Blood tests that check for:  Low iron levels (anemia).  Gestational diabetes (between 24 and 28 weeks).  Rh antibodies.  Urine tests to check for infections, diabetes, or protein in the urine.  An  ultrasound to confirm the proper growth and development of the baby.  An amniocentesis to check for possible genetic problems.  Fetal screens for spina bifida and Down syndrome.  HIV (human immunodeficiency virus) testing. Routine prenatal testing includes screening for HIV, unless you choose not to have this test. HOME CARE INSTRUCTIONS   Avoid all smoking, herbs, alcohol, and unprescribed drugs. These chemicals affect the formation and growth of the baby.  Do not use any tobacco products,  including cigarettes, chewing tobacco, and electronic cigarettes. If you need help quitting, ask your health care provider. You may receive counseling support and other resources to help you quit.  Follow your health care provider's instructions regarding medicine use. There are medicines that are either safe or unsafe to take during pregnancy.  Exercise only as directed by your health care provider. Experiencing uterine cramps is a good sign to stop exercising.  Continue to eat regular, healthy meals.  Wear a good support bra for breast tenderness.  Do not use hot tubs, steam rooms, or saunas.  Wear your seat belt at all times when driving.  Avoid raw meat, uncooked cheese, cat litter boxes, and soil used by cats. These carry germs that can cause birth defects in the baby.  Take your prenatal vitamins.  Take 1500-2000 mg of calcium daily starting at the 20th week of pregnancy until you deliver your baby.  Try taking a stool softener (if your health care provider approves) if you develop constipation. Eat more high-fiber foods, such as fresh vegetables or fruit and whole grains. Drink plenty of fluids to keep your urine clear or pale yellow.  Take warm sitz baths to soothe any pain or discomfort caused by hemorrhoids. Use hemorrhoid cream if your health care provider approves.  If you develop varicose veins, wear support hose. Elevate your feet for 15 minutes, 3-4 times a day. Limit salt in your diet.  Avoid heavy lifting, wear low heel shoes, and practice good posture.  Rest with your legs elevated if you have leg cramps or low back pain.  Visit your dentist if you have not gone yet during your pregnancy. Use a soft toothbrush to brush your teeth and be gentle when you floss.  A sexual relationship may be continued unless your health care provider directs you otherwise.  Continue to go to all your prenatal visits as directed by your health care provider. SEEK MEDICAL CARE IF:    You have dizziness.  You have mild pelvic cramps, pelvic pressure, or nagging pain in the abdominal area.  You have persistent nausea, vomiting, or diarrhea.  You have a bad smelling vaginal discharge.  You have pain with urination. SEEK IMMEDIATE MEDICAL CARE IF:   You have a fever.  You are leaking fluid from your vagina.  You have spotting or bleeding from your vagina.  You have severe abdominal cramping or pain.  You have rapid weight gain or loss.  You have shortness of breath with chest pain.  You notice sudden or extreme swelling of your face, hands, ankles, feet, or legs.  You have not felt your baby move in over an hour.  You have severe headaches that do not go away with medicine.  You have vision changes.   This information is not intended to replace advice given to you by your health care provider. Make sure you discuss any questions you have with your health care provider.   Document Released: 10/22/2001 Document Revised: 11/18/2014 Document Reviewed: 12/29/2012 Elsevier Interactive Patient  Education 2016 [employer].

## 2016-08-29 LAB — MATERNAL SCREEN, INTEGRATED #1
CROWN RUMP LENGTH MAT SCREEN: 57.2 mm
GEST. AGE ON COLLECTION DATE: 12.3 wk
MATERNAL AGE AT EDD: 22 a
NUMBER OF FETUSES: 1
Nuchal Translucency (NT): 1.2 mm
PAPP-A Value: 410 ng/mL
WEIGHT: 129 [lb_av]

## 2016-09-24 ENCOUNTER — Ambulatory Visit (INDEPENDENT_AMBULATORY_CARE_PROVIDER_SITE_OTHER): Payer: Medicaid Other | Admitting: Obstetrics & Gynecology

## 2016-09-24 VITALS — BP 112/60 | HR 88 | Wt 130.0 lb

## 2016-09-24 DIAGNOSIS — Z1389 Encounter for screening for other disorder: Secondary | ICD-10-CM

## 2016-09-24 DIAGNOSIS — Z3481 Encounter for supervision of other normal pregnancy, first trimester: Secondary | ICD-10-CM

## 2016-09-24 DIAGNOSIS — I729 Aneurysm of unspecified site: Secondary | ICD-10-CM

## 2016-09-24 DIAGNOSIS — Z331 Pregnant state, incidental: Secondary | ICD-10-CM

## 2016-09-24 DIAGNOSIS — G935 Compression of brain: Secondary | ICD-10-CM

## 2016-09-24 DIAGNOSIS — Z3482 Encounter for supervision of other normal pregnancy, second trimester: Secondary | ICD-10-CM

## 2016-09-24 DIAGNOSIS — Z3A16 16 weeks gestation of pregnancy: Secondary | ICD-10-CM

## 2016-09-24 LAB — POCT URINALYSIS DIPSTICK
Glucose, UA: NEGATIVE
Ketones, UA: NEGATIVE
Leukocytes, UA: NEGATIVE
Nitrite, UA: NEGATIVE
RBC UA: NEGATIVE

## 2016-09-24 NOTE — Progress Notes (Signed)
G2P1001 5236w0d Estimated Date of Delivery: 03/11/17  Blood pressure 112/60, pulse 88, weight 130 lb (59 kg), last menstrual period 06/04/2016.   BP weight and urine results all reviewed and noted.  Please refer to the obstetrical flow sheet for the fundal height and fetal heart rate documentation:  Patient reports good fetal movement, denies any bleeding and no rupture of membranes symptoms or regular contractions. Patient is without complaints. All questions were answered.  Orders Placed This Encounter  Procedures  . US OB Comp + 14 Wk  . Maternal Screen, Integrated #2  . POCT urinalysis dipstick    Plan:  Continued routine obstetrical care, see neurology note below. Want to get neurology consult to answer questions like regional anesthesia and maternal expulsive efforts  Sonogram next visit  Return in about 4 weeks (around 10/22/2016) for 20 week sono, LROB.       Copy of last note from [facility]: 03/15/2015  Abelardo DieselKlein, Matthew Hayner, PA - 03/15/2015 12:49 PM EDT Formatting of this note may be different from the original. NEUROSURGERY CLINIC VISIT NOTE  DATE OF VISIT: 03/15/2015  NSU ATTENDING: Dr. Jayme CloudGonzalez  DIAGNOSIS:  Encounter Diagnoses  Name Primary?  . Cerebral aneurysm Yes  . Chiari malformation type I   REASON FOR VISIT: Chiari and cavernous aneurysm  HISTORY OF PRESENT ILLNESS:  20 y.o. 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 Occasional eadaches. No further Syncopal episodes. She now returns with completed imaging.   Past Medical History  Diagnosis Date  . Syncope 2011  preceeed by headache, visual disturbance and tinnitus  . Chiari malformation type I 11/15/10  . 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 Scheduled Meds: Continuous Infusions: PRN Meds:.  No Known Allergies History   Social History  . Marital Status:  Single  Spouse Name: N/A  . Number of Children: N/A  . Years of Education: N/A   Social History Main Topics  . Smoking status: Current Every Day Smoker -- 0.25 packs/day  Types: Cigarettes  . Smokeless tobacco: Not on file  . Alcohol Use: No  . Drug Use: Not on file  . Sexual Activity: Yes   Other Topics Concern  . None   Social History Narrative  Lives with parents and 5 siblings   Family History  Problem Relation Age of Onset  . Other Mother  Chronic headaches  . Migraines Cousin  . Seizures Cousin   REVIEW OF SYSTEMS:  Review of Systems  Constitutional: Negative for fever and chills.  HENT: Negative for ear pain.  Eyes: Negative for blurred vision, double vision and photophobia.  Respiratory: Negative for cough and shortness of breath.  Cardiovascular: Negative for chest pain and palpitations.  Gastrointestinal: Negative for heartburn, nausea and vomiting.  Genitourinary: Negative for dysuria, urgency and frequency.  Skin: Negative.  Neurological: Negative for headaches.   PHYSICAL EXAMINATION:  Filed Vitals:  03/15/15 1207  BP: 108/68  Pulse: 63  Temp: 36.8 C (98.3 F)   General: Alert and oriented to person, place and time.  Well-appearing, well-nourished. NAD. HEENT: Normocephalic. Atraumatic. Extremity: Warm and well perfused. No edema. Neuro: EOMI. PERRL Strenght 5/5 in RUE, 5/5 in LUE Strenght 5/5 in RLE, 5/5 in LLE Gait normal.   LABS:  No results found for: WBC, HGB, HCT, PLT, CHOL, TRIG, HDL, LDLDIRECT, ALT, AST, NA, K, CL, CREATININE, BUN, CO2, TSH, PSA, INR, GLUF, HGBA1C, MICROALBUR  IMAGING:  MRA head: Interval increase in previously described cavernous aneurysm from 6.8 x 9.7 mm to 9 x 10.7 mm.   A/P: 20 y.o. 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 Occasional eadaches. No further Syncopal episodes. Got glasses since 1 y ago.  The natural history and  course of the lesion were discussed with the family and patient along with Issues, risks, benefits concerning treatment and diagnosis. There are no barriers to understanding the plan of treatment. Explanation was well received by patient and family who then verbalized understanding. Patient wishes to proceed with monitoring the lesion. Went over symptoms that should cause her to go to the hospital. We also discussed avoidance of tobacco, controlling BP and cholesterol.   1. Follow up imaging via MRA in 1 year. Patient will schedule. 2. Talked about symptoms to go to hospital for (changes in vision, proptosis, others).  I discussed this plan with Dr. Jayme CloudGonzalez who is in agreement.  I personally performed the service.   MATTHEW Leonette NuttingHAYNER KLEIN, PA

## 2016-09-26 LAB — MATERNAL SCREEN, INTEGRATED #2
ADSF: 1.33
AFP MoM: 1.39
Alpha-Fetoprotein: 48.7 ng/mL
Crown Rump Length: 57.2 mm
DIA MOM: 1.12
DIA VALUE: 216.8 pg/mL
ESTRIOL UNCONJUGATED: 1.19 ng/mL
GESTATIONAL AGE: 16.3 wk
Gest. Age on Collection Date: 12.3 weeks
HCG MOM: 2.05
HCG VALUE: 72.1 [IU]/mL
MATERNAL AGE AT EDD: 22 a
NUCHAL TRANSLUCENCY (NT): 1.2 mm
NUCHAL TRANSLUCENCY MOM: 0.81
NUMBER OF FETUSES: 1
PAPP-A MOM: 0.39
PAPP-A Value: 410 ng/mL
Test Results:: NEGATIVE
WEIGHT: 129 [lb_av]
Weight: 129 [lb_av]

## 2016-10-21 ENCOUNTER — Other Ambulatory Visit: Payer: Self-pay | Admitting: Obstetrics & Gynecology

## 2016-10-21 DIAGNOSIS — Z363 Encounter for antenatal screening for malformations: Secondary | ICD-10-CM

## 2016-10-22 ENCOUNTER — Ambulatory Visit (INDEPENDENT_AMBULATORY_CARE_PROVIDER_SITE_OTHER): Payer: Medicaid Other | Admitting: Women's Health

## 2016-10-22 ENCOUNTER — Encounter: Payer: Self-pay | Admitting: Women's Health

## 2016-10-22 ENCOUNTER — Ambulatory Visit (INDEPENDENT_AMBULATORY_CARE_PROVIDER_SITE_OTHER): Payer: Medicaid Other

## 2016-10-22 VITALS — BP 92/50 | HR 68 | Wt 132.0 lb

## 2016-10-22 DIAGNOSIS — Z3A20 20 weeks gestation of pregnancy: Secondary | ICD-10-CM

## 2016-10-22 DIAGNOSIS — Z1389 Encounter for screening for other disorder: Secondary | ICD-10-CM

## 2016-10-22 DIAGNOSIS — Z363 Encounter for antenatal screening for malformations: Secondary | ICD-10-CM

## 2016-10-22 DIAGNOSIS — Z3482 Encounter for supervision of other normal pregnancy, second trimester: Secondary | ICD-10-CM

## 2016-10-22 DIAGNOSIS — Z3402 Encounter for supervision of normal first pregnancy, second trimester: Secondary | ICD-10-CM

## 2016-10-22 DIAGNOSIS — Z331 Pregnant state, incidental: Secondary | ICD-10-CM

## 2016-10-22 NOTE — Patient Instructions (Signed)
Second Trimester of Pregnancy The second trimester is from week 13 through week 28 (months 4 through 6). The second trimester is often a time when you feel your best. Your body has also adjusted to being pregnant, and you begin to feel better physically. Usually, morning sickness has lessened or quit completely, you may have more energy, and you may have an increase in appetite. The second trimester is also a time when the fetus is growing rapidly. At the end of the sixth month, the fetus is about 9 inches long and weighs about 1 pounds. You will likely begin to feel the baby move (quickening) between 18 and 20 weeks of the pregnancy. Body changes during your second trimester Your body continues to go through many changes during your second trimester. The changes vary from woman to woman.  Your weight will continue to increase. You will notice your lower abdomen bulging out.  You may begin to get stretch marks on your hips, abdomen, and breasts.  You may develop headaches that can be relieved by medicines. The medicines should be approved by your health care provider.  You may urinate more often because the fetus is pressing on your bladder.  You may develop or continue to have heartburn as a result of your pregnancy.  You may develop constipation because certain hormones are causing the muscles that push waste through your intestines to slow down.  You may develop hemorrhoids or swollen, bulging veins (varicose veins).  You may have back pain. This is caused by:  Weight gain.  Pregnancy hormones that are relaxing the joints in your pelvis.  A shift in weight and the muscles that support your balance.  Your breasts will continue to grow and they will continue to become tender.  Your gums may bleed and may be sensitive to brushing and flossing.  Dark spots or blotches (chloasma, mask of pregnancy) may develop on your face. This will likely fade after the baby is born.  A dark line  from your belly button to the pubic area (linea nigra) may appear. This will likely fade after the baby is born.  You may have changes in your hair. These can include thickening of your hair, rapid growth, and changes in texture. Some women also have hair loss during or after pregnancy, or hair that feels dry or thin. Your hair will most likely return to normal after your baby is born. What to expect at prenatal visits During a routine prenatal visit:  You will be weighed to make sure you and the fetus are growing normally.  Your blood pressure will be taken.  Your abdomen will be measured to track your baby's growth.  The fetal heartbeat will be listened to.  Any test results from the previous visit will be discussed. Your health care provider may ask you:  How you are feeling.  If you are feeling the baby move.  If you have had any abnormal symptoms, such as leaking fluid, bleeding, severe headaches, or abdominal cramping.  If you are using any tobacco products, including cigarettes, chewing tobacco, and electronic cigarettes.  If you have any questions. Other tests that may be performed during your second trimester include:  Blood tests that check for:  Low iron levels (anemia).  Gestational diabetes (between 24 and 28 weeks).  Rh antibodies. This is to check for a protein on red blood cells (Rh factor).  Urine tests to check for infections, diabetes, or protein in the urine.  An ultrasound to   confirm the proper growth and development of the baby.  An amniocentesis to check for possible genetic problems.  Fetal screens for spina bifida and Down syndrome.  HIV (human immunodeficiency virus) testing. Routine prenatal testing includes screening for HIV, unless you choose not to have this test. Follow these instructions at home: Eating and drinking  Continue to eat regular, healthy meals.  Avoid raw meat, uncooked cheese, cat litter boxes, and soil used by cats. These  carry germs that can cause birth defects in the baby.  Take your prenatal vitamins.  Take 1500-2000 mg of calcium daily starting at the 20th week of pregnancy until you deliver your baby.  If you develop constipation:  Take over-the-counter or prescription medicines.  Drink enough fluid to keep your urine clear or pale yellow.  Eat foods that are high in fiber, such as fresh fruits and vegetables, whole grains, and beans.  Limit foods that are high in fat and processed sugars, such as fried and sweet foods. Activity  Exercise only as directed by your health care provider. Experiencing uterine cramps is a good sign to stop exercising.  Avoid heavy lifting, wear low heel shoes, and practice good posture.  Wear your seat belt at all times when driving.  Rest with your legs elevated if you have leg cramps or low back pain.  Wear a good support bra for breast tenderness.  Do not use hot tubs, steam rooms, or saunas. Lifestyle  Avoid all smoking, herbs, alcohol, and unprescribed drugs. These chemicals affect the formation and growth of the baby.  Do not use any products that contain nicotine or tobacco, such as cigarettes and e-cigarettes. If you need help quitting, ask your health care provider.  A sexual relationship may be continued unless your health care provider directs you otherwise. General instructions  Follow your health care provider's instructions regarding medicine use. There are medicines that are either safe or unsafe to take during pregnancy.  Take warm sitz baths to soothe any pain or discomfort caused by hemorrhoids. Use hemorrhoid cream if your health care provider approves.  If you develop varicose veins, wear support hose. Elevate your feet for 15 minutes, 3-4 times a day. Limit salt in your diet.  Visit your dentist if you have not gone yet during your pregnancy. Use a soft toothbrush to brush your teeth and be gentle when you floss.  Keep all follow-up  prenatal visits as told by your health care provider. This is important. Contact a health care provider if:  You have dizziness.  You have mild pelvic cramps, pelvic pressure, or nagging pain in the abdominal area.  You have persistent nausea, vomiting, or diarrhea.  You have a bad smelling vaginal discharge.  You have pain with urination. Get help right away if:  You have a fever.  You are leaking fluid from your vagina.  You have spotting or bleeding from your vagina.  You have severe abdominal cramping or pain.  You have rapid weight gain or weight loss.  You have shortness of breath with chest pain.  You notice sudden or extreme swelling of your face, hands, ankles, feet, or legs.  You have not felt your baby move in over an hour.  You have severe headaches that do not go away with medicine.  You have vision changes. Summary  The second trimester is from week 13 through week 28 (months 4 through 6). It is also a time when the fetus is growing rapidly.  Your body goes   through many changes during pregnancy. The changes vary from woman to woman.  Avoid all smoking, herbs, alcohol, and unprescribed drugs. These chemicals affect the formation and growth your baby.  Do not use any tobacco products, such as cigarettes, chewing tobacco, and e-cigarettes. If you need help quitting, ask your health care provider.  Contact your health care provider if you have any questions. Keep all prenatal visits as told by your health care provider. This is important. This information is not intended to replace advice given to you by your health care provider. Make sure you discuss any questions you have with your health care provider. Document Released: 10/22/2001 Document Revised: 04/04/2016 Document Reviewed: 12/29/2012 Elsevier Interactive Patient Education  2017 [employer]  

## 2016-10-22 NOTE — Progress Notes (Signed)
US 20 wks,cephalic,post pl gr 0,normal ov's bilat,cx 3.6 cm,svp of fluid 3.7 cm,fhr 148 bpm,complex left choroid plexus cyst 8.2 x 4.2 x 6.5 mm,efw 331 g,anatomy complete

## 2016-10-22 NOTE — Progress Notes (Signed)
Low-risk OB appointment G2P1001 2232w0d Estimated Date of Delivery: 03/11/17 BP (!) 92/50   Pulse 68   Wt 132 lb (59.9 kg)   LMP 06/04/2016 (Exact Date)   BMI 22.66 kg/m   BP, weight, and urine reviewed.  Refer to obstetrical flow sheet for FH & FHR.  Reports good fm.  Denies regular uc's, lof, vb, or uti s/s. No complaints. Had appt 11/28 w/ neuro, however MD cancelled d/t emergency, is rescheduled for 2/15 @ 2:45 and she is on call-list in case anyone cancels. Reviewed today's anatomy u/s, female w/ isolated Lt CP cyst, otherwise normal. NT/IT was neg. Gave printed info on CP cysts, will repeat u/s @ 28wks.  Plan:  Continue routine obstetrical care  F/U in 4wks for OB appointment

## 2016-11-11 NOTE — L&D Delivery Note (Signed)
Delivery Note At 12:36 AM a viable female was delivered via Vaginal, Spontaneous Delivery (Presentation: LOA).  APGAR: 8, 9; weight pending.   Placenta status: Spontaneous, intact. Cord: 3 vessel.    Anesthesia:  IV Episiotomy:  none Lacerations:  none Est. Blood Loss (mL):  100  Mom to postpartum.  Baby to Couplet care / Skin to Skin.  Cleone Slim 03/01/2017, 12:52 AM  Patient is a G2P1001 at [redacted]w[redacted]d who was admitted w/ SROM, significant hx of Chiari malformation and carotid aneurysm (approved for vag del), but otherwise uncomplicated prenatal course.  She progressed with augmentation via Pit.  I was gloved and present for delivery in its entirety.  Second stage of labor progressed, baby delivered after pushing x .  Mild decels during second stage noted.  Complications: none  Lacerations: none  EBL: 100cc  Cam Hai, CNM 3:31 AM  03/01/2017

## 2016-11-19 ENCOUNTER — Ambulatory Visit (INDEPENDENT_AMBULATORY_CARE_PROVIDER_SITE_OTHER): Payer: Medicaid Other | Admitting: Women's Health

## 2016-11-19 ENCOUNTER — Encounter: Payer: Self-pay | Admitting: Women's Health

## 2016-11-19 VITALS — BP 122/69 | HR 93 | Wt 139.0 lb

## 2016-11-19 DIAGNOSIS — Z1389 Encounter for screening for other disorder: Secondary | ICD-10-CM

## 2016-11-19 DIAGNOSIS — Z331 Pregnant state, incidental: Secondary | ICD-10-CM

## 2016-11-19 DIAGNOSIS — Z3482 Encounter for supervision of other normal pregnancy, second trimester: Secondary | ICD-10-CM

## 2016-11-19 DIAGNOSIS — Z3A24 24 weeks gestation of pregnancy: Secondary | ICD-10-CM

## 2016-11-19 DIAGNOSIS — IMO0001 Reserved for inherently not codable concepts without codable children: Secondary | ICD-10-CM

## 2016-11-19 DIAGNOSIS — O350XX0 Maternal care for (suspected) central nervous system malformation in fetus, not applicable or unspecified: Secondary | ICD-10-CM

## 2016-11-19 LAB — POCT URINALYSIS DIPSTICK
Blood, UA: NEGATIVE
Glucose, UA: NEGATIVE
Ketones, UA: NEGATIVE
LEUKOCYTES UA: NEGATIVE
NITRITE UA: NEGATIVE
PROTEIN UA: NEGATIVE

## 2016-11-19 NOTE — Progress Notes (Signed)
Low-risk OB appointment G2P1001 1380w0d Estimated Date of Delivery: 03/11/17 BP 122/69   Pulse 93   Wt 139 lb (63 kg)   LMP 06/04/2016 (Exact Date)   BMI 23.86 kg/m   BP, weight, and urine reviewed.  Refer to obstetrical flow sheet for FH & FHR.  Reports good fm.  Denies regular uc's, lof, vb, or uti s/s. No complaints. Still has appt scheduled for neuro 2/15 for chiari malformation and carotid aneurysm, needs updated MRI Reviewed ptl s/s, fm. Plan:  Continue routine obstetrical care  F/U in 4wks for OB appointment, pn2, and f/u u/s for CP cysts

## 2016-11-19 NOTE — Patient Instructions (Signed)
You will have your sugar test next visit.  Please do not eat or drink anything after midnight the night before you come, not even water.  You will be here for at least two hours.     Call the office ([phone]) or go to [facility] if:  You begin to have strong, frequent contractions  Your water breaks.  Sometimes it is a big gush of fluid, sometimes it is just a trickle that keeps getting your panties wet or running down your legs  You have vaginal bleeding.  It is normal to have a small amount of spotting if your cervix was checked.   You don't feel your baby moving like normal.  If you don't, get you something to eat and drink and lay down and focus on feeling your baby move.   If your baby is still not moving like normal, you should call the office or go to [facility].  Second Trimester of Pregnancy The second trimester is from week 13 through week 28, months 4 through 6. The second trimester is often a time when you feel your best. Your body has also adjusted to being pregnant, and you begin to feel better physically. Usually, morning sickness has lessened or quit completely, you may have more energy, and you may have an increase in appetite. The second trimester is also a time when the fetus is growing rapidly. At the end of the sixth month, the fetus is about 9 inches long and weighs about 1 pounds. You will likely begin to feel the baby move (quickening) between 18 and 20 weeks of the pregnancy. BODY CHANGES Your body goes through many changes during pregnancy. The changes vary from woman to woman.   Your weight will continue to increase. You will notice your lower abdomen bulging out.  You may begin to get stretch marks on your hips, abdomen, and breasts.  You may develop headaches that can be relieved by medicines approved by your health care provider.  You may urinate more often because the fetus is pressing on your bladder.  You may develop or continue to have  heartburn as a result of your pregnancy.  You may develop constipation because certain hormones are causing the muscles that push waste through your intestines to slow down.  You may develop hemorrhoids or swollen, bulging veins (varicose veins).  You may have back pain because of the weight gain and pregnancy hormones relaxing your joints between the bones in your pelvis and as a result of a shift in weight and the muscles that support your balance.  Your breasts will continue to grow and be tender.  Your gums may bleed and may be sensitive to brushing and flossing.  Dark spots or blotches (chloasma, mask of pregnancy) may develop on your face. This will likely fade after the baby is born.  A dark line from your belly button to the pubic area (linea nigra) may appear. This will likely fade after the baby is born.  You may have changes in your hair. These can include thickening of your hair, rapid growth, and changes in texture. Some women also have hair loss during or after pregnancy, or hair that feels dry or thin. Your hair will most likely return to normal after your baby is born. WHAT TO EXPECT AT YOUR PRENATAL VISITS During a routine prenatal visit:  You will be weighed to make sure you and the fetus are growing normally.  Your blood pressure will be taken.    Your abdomen will be measured to track your baby's growth.  The fetal heartbeat will be listened to.  Any test results from the previous visit will be discussed. Your health care provider may ask you:  How you are feeling.  If you are feeling the baby move.  If you have had any abnormal symptoms, such as leaking fluid, bleeding, severe headaches, or abdominal cramping.  If you have any questions. Other tests that may be performed during your second trimester include:  Blood tests that check for:  Low iron levels (anemia).  Gestational diabetes (between 24 and 28 weeks).  Rh antibodies.  Urine tests to check  for infections, diabetes, or protein in the urine.  An ultrasound to confirm the proper growth and development of the baby.  An amniocentesis to check for possible genetic problems.  Fetal screens for spina bifida and Down syndrome. HOME CARE INSTRUCTIONS   Avoid all smoking, herbs, alcohol, and unprescribed drugs. These chemicals affect the formation and growth of the baby.  Follow your health care provider's instructions regarding medicine use. There are medicines that are either safe or unsafe to take during pregnancy.  Exercise only as directed by your health care provider. Experiencing uterine cramps is a good sign to stop exercising.  Continue to eat regular, healthy meals.  Wear a good support bra for breast tenderness.  Do not use hot tubs, steam rooms, or saunas.  Wear your seat belt at all times when driving.  Avoid raw meat, uncooked cheese, cat litter boxes, and soil used by cats. These carry germs that can cause birth defects in the baby.  Take your prenatal vitamins.  Try taking a stool softener (if your health care provider approves) if you develop constipation. Eat more high-fiber foods, such as fresh vegetables or fruit and whole grains. Drink plenty of fluids to keep your urine clear or pale yellow.  Take warm sitz baths to soothe any pain or discomfort caused by hemorrhoids. Use hemorrhoid cream if your health care provider approves.  If you develop varicose veins, wear support hose. Elevate your feet for 15 minutes, 3-4 times a day. Limit salt in your diet.  Avoid heavy lifting, wear low heel shoes, and practice good posture.  Rest with your legs elevated if you have leg cramps or low back pain.  Visit your dentist if you have not gone yet during your pregnancy. Use a soft toothbrush to brush your teeth and be gentle when you floss.  A sexual relationship may be continued unless your health care provider directs you otherwise.  Continue to go to all your  prenatal visits as directed by your health care provider. SEEK MEDICAL CARE IF:   You have dizziness.  You have mild pelvic cramps, pelvic pressure, or nagging pain in the abdominal area.  You have persistent nausea, vomiting, or diarrhea.  You have a bad smelling vaginal discharge.  You have pain with urination. SEEK IMMEDIATE MEDICAL CARE IF:   You have a fever.  You are leaking fluid from your vagina.  You have spotting or bleeding from your vagina.  You have severe abdominal cramping or pain.  You have rapid weight gain or loss.  You have shortness of breath with chest pain.  You notice sudden or extreme swelling of your face, hands, ankles, feet, or legs.  You have not felt your baby move in over an hour.  You have severe headaches that do not go away with medicine.  You have vision changes.   Document Released: 10/22/2001 Document Revised: 11/02/2013 Document Reviewed: 12/29/2012 ExitCare Patient Information 2015 [employer]. This information is not intended to replace advice given to you by your health care provider. Make sure you discuss any questions you have with your health care provider.     

## 2016-12-19 ENCOUNTER — Encounter: Payer: Self-pay | Admitting: Women's Health

## 2016-12-19 ENCOUNTER — Ambulatory Visit (INDEPENDENT_AMBULATORY_CARE_PROVIDER_SITE_OTHER): Payer: Medicaid Other | Admitting: Women's Health

## 2016-12-19 ENCOUNTER — Other Ambulatory Visit: Payer: Medicaid Other

## 2016-12-19 ENCOUNTER — Ambulatory Visit (INDEPENDENT_AMBULATORY_CARE_PROVIDER_SITE_OTHER): Payer: Medicaid Other

## 2016-12-19 VITALS — BP 108/58 | HR 76 | Wt 142.0 lb

## 2016-12-19 DIAGNOSIS — Z3A28 28 weeks gestation of pregnancy: Secondary | ICD-10-CM

## 2016-12-19 DIAGNOSIS — Z1339 Encounter for screening examination for other mental health and behavioral disorders: Secondary | ICD-10-CM

## 2016-12-19 DIAGNOSIS — O350XX0 Maternal care for (suspected) central nervous system malformation in fetus, not applicable or unspecified: Secondary | ICD-10-CM

## 2016-12-19 DIAGNOSIS — Z331 Pregnant state, incidental: Secondary | ICD-10-CM

## 2016-12-19 DIAGNOSIS — Z131 Encounter for screening for diabetes mellitus: Secondary | ICD-10-CM

## 2016-12-19 DIAGNOSIS — Z1389 Encounter for screening for other disorder: Secondary | ICD-10-CM

## 2016-12-19 DIAGNOSIS — Z3402 Encounter for supervision of normal first pregnancy, second trimester: Secondary | ICD-10-CM

## 2016-12-19 DIAGNOSIS — Z3482 Encounter for supervision of other normal pregnancy, second trimester: Secondary | ICD-10-CM

## 2016-12-19 DIAGNOSIS — Z3483 Encounter for supervision of other normal pregnancy, third trimester: Secondary | ICD-10-CM

## 2016-12-19 DIAGNOSIS — IMO0001 Reserved for inherently not codable concepts without codable children: Secondary | ICD-10-CM

## 2016-12-19 LAB — POCT URINALYSIS DIPSTICK
Blood, UA: NEGATIVE
GLUCOSE UA: NEGATIVE
KETONES UA: NEGATIVE
Leukocytes, UA: NEGATIVE
Nitrite, UA: NEGATIVE
Protein, UA: NEGATIVE

## 2016-12-19 NOTE — Progress Notes (Signed)
US 28+2 wks,cephalic,cx 4.1 cm,post pl gr 0,bilat adnexa's wnl,afi 10 cm,fhr 141 bpm,EFW 1326 g 63%,resolved choroid plexus cyst,no obvious abnormalities seen

## 2016-12-19 NOTE — Progress Notes (Addendum)
Low-risk OB appointment G2P1001 3732w2d Estimated Date of Delivery: 03/11/17 BP (!) 108/58   Pulse 76   Wt 142 lb (64.4 kg)   LMP 06/04/2016 (Exact Date)   BMI 24.37 kg/m   BP, weight, and urine reviewed.  Refer to obstetrical flow sheet for FH & FHR.  Reports good fm.  Denies regular uc's, lof, vb, or uti s/s. No complaints. Sees neuro next week 2/15 for f/u on chiari malformation and carotid aneurysm, needs mri/mra Reviewed today's f/u u/s for cp cyst- has resolved. Normal afi and efw. Discussed ptl s/s, fkc. Recommended Tdap at HD/PCP per CDC guidelines.  Plan:  Continue routine obstetrical care  F/U in 4wks for OB appointment  PN2 today

## 2016-12-19 NOTE — Patient Instructions (Signed)

## 2016-12-20 ENCOUNTER — Telehealth: Payer: Self-pay | Admitting: *Deleted

## 2016-12-20 ENCOUNTER — Other Ambulatory Visit: Payer: Self-pay | Admitting: Women's Health

## 2016-12-20 LAB — GLUCOSE TOLERANCE, 2 HOURS W/ 1HR
GLUCOSE, 2 HOUR: 110 mg/dL (ref 65–152)
Glucose, 1 hour: 137 mg/dL (ref 65–179)
Glucose, Fasting: 81 mg/dL (ref 65–91)

## 2016-12-20 LAB — CBC
Hematocrit: 31.7 % — ABNORMAL LOW (ref 34.0–46.6)
Hemoglobin: 10.4 g/dL — ABNORMAL LOW (ref 11.1–15.9)
MCH: 31.9 pg (ref 26.6–33.0)
MCHC: 32.8 g/dL (ref 31.5–35.7)
MCV: 97 fL (ref 79–97)
Platelets: 253 10*3/uL (ref 150–379)
RBC: 3.26 x10E6/uL — ABNORMAL LOW (ref 3.77–5.28)
RDW: 12.5 % (ref 12.3–15.4)
WBC: 12.8 10*3/uL — AB (ref 3.4–10.8)

## 2016-12-20 LAB — RPR: RPR: NONREACTIVE

## 2016-12-20 LAB — ANTIBODY SCREEN: Antibody Screen: NEGATIVE

## 2016-12-20 LAB — HIV ANTIBODY (ROUTINE TESTING W REFLEX): HIV Screen 4th Generation wRfx: NONREACTIVE

## 2016-12-20 MED ORDER — FERROUS SULFATE 325 (65 FE) MG PO TABS: 325.0000 mg | ORAL_TABLET | Freq: Two times a day (BID) | ORAL | 3 refills | Status: DC

## 2016-12-20 NOTE — Telephone Encounter (Signed)
Left message on VM that she is anemic, rx for fe to her pharmacy, and to continue taking pnv daily, increase fe-rich foods: red meats, green leafy vegs, beans, etc.

## 2017-01-16 ENCOUNTER — Ambulatory Visit (INDEPENDENT_AMBULATORY_CARE_PROVIDER_SITE_OTHER): Payer: Medicaid Other | Admitting: Advanced Practice Midwife

## 2017-01-16 ENCOUNTER — Encounter: Payer: Self-pay | Admitting: Advanced Practice Midwife

## 2017-01-16 ENCOUNTER — Ambulatory Visit: Payer: Medicaid Other

## 2017-01-16 VITALS — BP 124/60 | HR 84 | Wt 147.0 lb

## 2017-01-16 DIAGNOSIS — Z1389 Encounter for screening for other disorder: Secondary | ICD-10-CM

## 2017-01-16 DIAGNOSIS — O360131 Maternal care for anti-D [Rh] antibodies, third trimester, fetus 1: Secondary | ICD-10-CM

## 2017-01-16 DIAGNOSIS — G935 Compression of brain: Secondary | ICD-10-CM

## 2017-01-16 DIAGNOSIS — O99353 Diseases of the nervous system complicating pregnancy, third trimester: Secondary | ICD-10-CM

## 2017-01-16 DIAGNOSIS — Z3A32 32 weeks gestation of pregnancy: Secondary | ICD-10-CM

## 2017-01-16 DIAGNOSIS — Z3483 Encounter for supervision of other normal pregnancy, third trimester: Secondary | ICD-10-CM

## 2017-01-16 DIAGNOSIS — Z331 Pregnant state, incidental: Secondary | ICD-10-CM

## 2017-01-16 DIAGNOSIS — Z6791 Unspecified blood type, Rh negative: Secondary | ICD-10-CM

## 2017-01-16 LAB — POCT URINALYSIS DIPSTICK
Blood, UA: NEGATIVE
Glucose, UA: NEGATIVE
Glucose, UA: NEGATIVE
Ketones, UA: NEGATIVE
LEUKOCYTES UA: NEGATIVE
Nitrite, UA: NEGATIVE
PROTEIN UA: NEGATIVE
PROTEIN UA: NEGATIVE

## 2017-01-16 MED ORDER — RHO D IMMUNE GLOBULIN 1500 UNIT/2ML IJ SOSY
300.0000 ug | PREFILLED_SYRINGE | Freq: Once | INTRAMUSCULAR | Status: AC
  Administered 2017-01-16: 300 ug via INTRAMUSCULAR

## 2017-01-16 NOTE — Progress Notes (Signed)
G2P1001 2891w2d Estimated Date of Delivery: 03/11/17  Blood pressure 124/60, pulse 84, weight 147 lb (66.7 kg), last menstrual period 06/04/2016.   BP weight and urine results all reviewed and noted.  [facility] re: Chiari malformation.  Did not order another MRI (says MD wants to wait until done breastfeeding), gave "OK" for SVD again,  Pt adamant that she doesn't want epidural  Please refer to the obstetrical flow sheet for the fundal height and fetal heart rate documentation:  Patient reports good fetal movement, denies any bleeding and no rupture of membranes symptoms or regular contractions. Patient is without complaints. All questions were answered.  Orders Placed This Encounter  Procedures  . POCT urinalysis dipstick    Plan:  Continued routine obstetrical care,   Return in about 2 weeks (around 01/30/2017) for LROB.

## 2017-01-27 ENCOUNTER — Telehealth: Payer: Self-pay | Admitting: *Deleted

## 2017-01-27 NOTE — Telephone Encounter (Signed)
Patient called with complaints of ankle and hand swelling. She is not having any blurred vision, or spots. She has had a slight headache but has not tried Tylenol. Advised patient to try taking Tylenol and if it became unresolved or worse or started seeing spots or blurred vision, then she needed to be seen. Pt verbalized understanding. Encouraged her to increase her fluid intake and elevate her legs when possible.

## 2017-01-30 ENCOUNTER — Encounter: Payer: Medicaid Other | Admitting: Women's Health

## 2017-02-12 ENCOUNTER — Ambulatory Visit (INDEPENDENT_AMBULATORY_CARE_PROVIDER_SITE_OTHER): Payer: Medicaid Other | Admitting: Women's Health

## 2017-02-12 ENCOUNTER — Encounter: Payer: Self-pay | Admitting: Women's Health

## 2017-02-12 VITALS — BP 114/64 | HR 76 | Wt 154.0 lb

## 2017-02-12 DIAGNOSIS — Z331 Pregnant state, incidental: Secondary | ICD-10-CM

## 2017-02-12 DIAGNOSIS — O26899 Other specified pregnancy related conditions, unspecified trimester: Secondary | ICD-10-CM

## 2017-02-12 DIAGNOSIS — Z3483 Encounter for supervision of other normal pregnancy, third trimester: Secondary | ICD-10-CM

## 2017-02-12 DIAGNOSIS — Z1389 Encounter for screening for other disorder: Secondary | ICD-10-CM

## 2017-02-12 DIAGNOSIS — Z6791 Unspecified blood type, Rh negative: Secondary | ICD-10-CM

## 2017-02-12 DIAGNOSIS — N9089 Other specified noninflammatory disorders of vulva and perineum: Secondary | ICD-10-CM

## 2017-02-12 DIAGNOSIS — O09899 Supervision of other high risk pregnancies, unspecified trimester: Secondary | ICD-10-CM

## 2017-02-12 LAB — POCT URINALYSIS DIPSTICK
Blood, UA: NEGATIVE
Glucose, UA: NEGATIVE
Ketones, UA: NEGATIVE
Leukocytes, UA: NEGATIVE
NITRITE UA: NEGATIVE
PROTEIN UA: NEGATIVE

## 2017-02-12 NOTE — Progress Notes (Signed)
Low-risk OB appointment G2P1001 [redacted]w[redacted]d Estimated Date of Delivery: 03/11/17 BP 114/64   Pulse 76   Wt 154 lb (69.9 kg)   LMP 06/04/2016 (Exact Date)   BMI 26.43 kg/m   BP, weight, and urine reviewed.  Refer to obstetrical flow sheet for FH & FHR.  Reports good fm.  Denies regular uc's, lof, vb, or uti s/s. Vulvar irritation/itching x 3-4 days, has used gentian violet thinking it was yeast- then developed 'rash' in genital area, also used apple cider vinegar and nystatin powder her mom had which helped some, but still there. Rash is painful.  External genitalia: multiple small red round slightly ulcerated lesions on bilateral labia minora and clitoral hood, HSV culture obtained and will get HSV 1&2 Ab Spec exam: cx visually closed, mod amt thick white nonodorous d/c, wet prep yeast, recommended otc monistat 7 Reviewed ptl s/s, fkc. Plan:  Continue routine obstetrical care  F/U in 1wk for OB appointment and gbs

## 2017-02-12 NOTE — Patient Instructions (Signed)
Call the office ([phone]) or go to [facility] if:  You begin to have strong, frequent contractions  Your water breaks.  Sometimes it is a big gush of fluid, sometimes it is just a trickle that keeps getting your panties wet or running down your legs  You have vaginal bleeding.  It is normal to have a small amount of spotting if your cervix was checked.   You don't feel your baby moving like normal.  If you don't, get you something to eat and drink and lay down and focus on feeling your baby move.  You should feel at least 10 movements in 2 hours.  If you don't, you should call the office or go to [facility].     Preterm Labor and Birth Information The normal length of a pregnancy is 39-41 weeks. Preterm labor is when labor starts before 37 completed weeks of pregnancy. What are the risk factors for preterm labor? Preterm labor is more likely to occur in women who:  Have certain infections during pregnancy such as a bladder infection, sexually transmitted infection, or infection inside the uterus (chorioamnionitis).  Have a shorter-than-normal cervix.  Have gone into preterm labor before.  Have had surgery on their cervix.  Are younger than age 17 or older than age 35.  Are African American.  Are pregnant with twins or multiple babies (multiple gestation).  Take street drugs or smoke while pregnant.  Do not gain enough weight while pregnant.  Became pregnant shortly after having been pregnant. What are the symptoms of preterm labor? Symptoms of preterm labor include:  Cramps similar to those that can happen during a menstrual period. The cramps may happen with diarrhea.  Pain in the abdomen or lower back.  Regular uterine contractions that may feel like tightening of the abdomen.  A feeling of increased pressure in the pelvis.  Increased watery or bloody mucus discharge from the vagina.  Water breaking (ruptured amniotic sac). Why is it important to  recognize signs of preterm labor? It is important to recognize signs of preterm labor because babies who are born prematurely may not be fully developed. This can put them at an increased risk for:  Long-term (chronic) heart and lung problems.  Difficulty immediately after birth with regulating body systems, including blood sugar, body temperature, heart rate, and breathing rate.  Bleeding in the brain.  Cerebral palsy.  Learning difficulties.  Death. These risks are highest for babies who are born before 34 weeks of pregnancy. How is preterm labor treated? Treatment depends on the length of your pregnancy, your condition, and the health of your baby. It may involve:  Having a stitch (suture) placed in your cervix to prevent your cervix from opening too early (cerclage).  Taking or being given medicines, such as:  Hormone medicines. These may be given early in pregnancy to help support the pregnancy.  Medicine to stop contractions.  Medicines to help mature the baby's lungs. These may be prescribed if the risk of delivery is high.  Medicines to prevent your baby from developing cerebral palsy. If the labor happens before 34 weeks of pregnancy, you may need to stay in the hospital. What should I do if I think I am in preterm labor? If you think that you are going into preterm labor, call your health care provider right away. How can I prevent preterm labor in future pregnancies? To increase your chance of having a full-term pregnancy:  Do not use any tobacco products, such   as cigarettes, chewing tobacco, and e-cigarettes. If you need help quitting, ask your health care provider.  Do not use street drugs or medicines that have not been prescribed to you during your pregnancy.  Talk with your health care provider before taking any herbal supplements, even if you have been taking them regularly.  Make sure you gain a healthy amount of weight during your pregnancy.  Watch for  infection. If you think that you might have an infection, get it checked right away.  Make sure to tell your health care provider if you have gone into preterm labor before. This information is not intended to replace advice given to you by your health care provider. Make sure you discuss any questions you have with your health care provider. Document Released: 01/18/2004 Document Revised: 04/09/2016 Document Reviewed: 03/20/2016 Elsevier Interactive Patient Education  2017 Elsevier Inc.  

## 2017-02-13 LAB — HSV(HERPES SIMPLEX VRS) I + II AB-IGG: HSV 1 Glycoprotein G Ab, IgG: <0.91 index (ref 0.00–0.90)

## 2017-02-15 LAB — HERPES SIMPLEX VIRUS CULTURE

## 2017-02-20 ENCOUNTER — Encounter: Payer: Self-pay | Admitting: Obstetrics and Gynecology

## 2017-02-20 ENCOUNTER — Ambulatory Visit (INDEPENDENT_AMBULATORY_CARE_PROVIDER_SITE_OTHER): Payer: Medicaid Other | Admitting: Obstetrics and Gynecology

## 2017-02-20 VITALS — BP 146/70 | HR 80 | Wt 158.0 lb

## 2017-02-20 DIAGNOSIS — Z3483 Encounter for supervision of other normal pregnancy, third trimester: Secondary | ICD-10-CM

## 2017-02-20 DIAGNOSIS — Z3A37 37 weeks gestation of pregnancy: Secondary | ICD-10-CM

## 2017-02-20 DIAGNOSIS — Z331 Pregnant state, incidental: Secondary | ICD-10-CM

## 2017-02-20 DIAGNOSIS — Z1389 Encounter for screening for other disorder: Secondary | ICD-10-CM

## 2017-02-20 LAB — POCT URINALYSIS DIPSTICK
Blood, UA: NEGATIVE
Glucose, UA: NEGATIVE
Ketones, UA: NEGATIVE
LEUKOCYTES UA: NEGATIVE
NITRITE UA: NEGATIVE
PROTEIN UA: NEGATIVE

## 2017-02-20 NOTE — Progress Notes (Signed)
[id number]  Estimated Date of Delivery: 03/11/17 [facility] [redacted]w[redacted]d  Chief Complaint  Patient presents with  . Routine Prenatal Visit    GBS, GC/CHL; back pain  ____  Patient complaints: none at this time. She reports having a brain aneurysm.  Patient reports   good fetal movement,                           denies any bleeding , rupture of membranes,or regular contractions.  Blood pressure (!) 146/70, pulse 80, weight 158 lb (71.7 kg), last menstrual period 06/04/2016.   Urine results:negative  refer to the ob flow sheet for FH and FHR, ,                          Physical Examination: General appearance - alert, well appearing, and in no distress                                      Abdomen - FH 37 ,                                                         -FHR 142                                                         soft, nontender, nondistended, no masses or organomegaly                                      Pelvic - 1 cm, long, vertex                                            Questions were answered. Assessment: LROB G2P1001 @ [redacted]w[redacted]d, Estimated Date of Delivery: 03/11/17   Plan:  Continued routine obstetrical care,   F/u in 1 week for LROB   By signing my name below, I, Sonum Patel, attest that this documentation has been prepared under the direction and in the presence of Tilda Burrow, MD. Electronically Signed: Sonum Patel, Neurosurgeon. 02/20/17. 2:09 PM.  I personally performed the services described in this documentation, which was SCRIBED in my presence. The recorded information has been reviewed and considered accurate. It has been edited as necessary during review. Tilda Burrow, MD

## 2017-02-22 LAB — STREP GP B NAA: STREP GROUP B AG: NEGATIVE

## 2017-02-23 LAB — GC/CHLAMYDIA PROBE AMP
Chlamydia trachomatis, NAA: NEGATIVE
Neisseria gonorrhoeae by PCR: NEGATIVE

## 2017-02-26 ENCOUNTER — Ambulatory Visit (INDEPENDENT_AMBULATORY_CARE_PROVIDER_SITE_OTHER): Payer: Medicaid Other | Admitting: Advanced Practice Midwife

## 2017-02-26 VITALS — BP 110/70 | HR 84 | Wt 158.4 lb

## 2017-02-26 DIAGNOSIS — Z331 Pregnant state, incidental: Secondary | ICD-10-CM

## 2017-02-26 DIAGNOSIS — Z3483 Encounter for supervision of other normal pregnancy, third trimester: Secondary | ICD-10-CM

## 2017-02-26 DIAGNOSIS — Z1389 Encounter for screening for other disorder: Secondary | ICD-10-CM

## 2017-02-26 LAB — POCT URINALYSIS DIPSTICK
Blood, UA: NEGATIVE
Glucose, UA: NEGATIVE
KETONES UA: NEGATIVE
LEUKOCYTES UA: NEGATIVE
Nitrite, UA: NEGATIVE
PROTEIN UA: NEGATIVE

## 2017-02-26 NOTE — Progress Notes (Signed)
G2P1001 [redacted]w[redacted]d Estimated Date of Delivery: 03/11/17  Blood pressure 110/70, pulse 84, weight 158 lb 6.4 oz (71.8 kg), last menstrual period 06/04/2016.   BP weight and urine results all reviewed and noted.  Please refer to the obstetrical flow sheet for the fundal height and fetal heart rate documentation: Vulvar lesions were culture neg and HSV 1&2 Igg were neg  Patient reports good fetal movement, denies any bleeding and no rupture of membranes symptoms or regular contractions. Patient is without complaints. All questions were answered.  Orders Placed This Encounter  Procedures  . POCT urinalysis dipstick    Plan:  Continued routine obstetrical care,   Return in about 1 week (around 03/05/2017) for LROB.

## 2017-02-26 NOTE — Patient Instructions (Signed)

## 2017-02-28 ENCOUNTER — Encounter (HOSPITAL_COMMUNITY): Payer: Self-pay

## 2017-02-28 ENCOUNTER — Inpatient Hospital Stay (HOSPITAL_COMMUNITY)
Admission: AD | Admit: 2017-02-28 | Discharge: 2017-03-02 | DRG: 774 | Disposition: A | Discharge status: Home / Self Care | Payer: Medicaid Other | Source: Ambulatory Visit | Attending: Family Medicine | Admitting: Family Medicine

## 2017-02-28 DIAGNOSIS — O26899 Other specified pregnancy related conditions, unspecified trimester: Secondary | ICD-10-CM

## 2017-02-28 DIAGNOSIS — Z87891 Personal history of nicotine dependence: Secondary | ICD-10-CM

## 2017-02-28 DIAGNOSIS — Z3A38 38 weeks gestation of pregnancy: Secondary | ICD-10-CM

## 2017-02-28 DIAGNOSIS — O4202 Full-term premature rupture of membranes, onset of labor within 24 hours of rupture: Principal | ICD-10-CM | POA: Diagnosis present

## 2017-02-28 DIAGNOSIS — O99354 Diseases of the nervous system complicating childbirth: Secondary | ICD-10-CM | POA: Diagnosis present

## 2017-02-28 DIAGNOSIS — I729 Aneurysm of unspecified site: Secondary | ICD-10-CM | POA: Diagnosis present

## 2017-02-28 DIAGNOSIS — I720 Aneurysm of carotid artery: Secondary | ICD-10-CM | POA: Diagnosis present

## 2017-02-28 DIAGNOSIS — Z3490 Encounter for supervision of normal pregnancy, unspecified, unspecified trimester: Secondary | ICD-10-CM

## 2017-02-28 DIAGNOSIS — O9942 Diseases of the circulatory system complicating childbirth: Secondary | ICD-10-CM | POA: Diagnosis present

## 2017-02-28 DIAGNOSIS — O26893 Other specified pregnancy related conditions, third trimester: Secondary | ICD-10-CM | POA: Diagnosis present

## 2017-02-28 DIAGNOSIS — Z6791 Unspecified blood type, Rh negative: Secondary | ICD-10-CM | POA: Diagnosis not present

## 2017-02-28 DIAGNOSIS — G935 Compression of brain: Secondary | ICD-10-CM | POA: Diagnosis present

## 2017-02-28 LAB — CBC
HEMATOCRIT: 30 % — AB (ref 36.0–46.0)
HEMOGLOBIN: 9.6 g/dL — AB (ref 12.0–15.0)
MCH: 27.4 pg (ref 26.0–34.0)
MCHC: 32 g/dL (ref 30.0–36.0)
MCV: 85.5 fL (ref 78.0–100.0)
Platelets: 258 10*3/uL (ref 150–400)
RBC: 3.51 MIL/uL — AB (ref 3.87–5.11)
RDW: 14.7 % (ref 11.5–15.5)
WBC: 14.4 10*3/uL — AB (ref 4.0–10.5)

## 2017-02-28 LAB — POCT FERN TEST

## 2017-02-28 MED ORDER — OXYTOCIN 40 UNITS IN LACTATED RINGERS INFUSION - SIMPLE MED
1.0000 m[IU]/min | INTRAVENOUS | Status: DC
Start: 2017-02-28 — End: 2017-03-01
  Administered 2017-02-28: 2 m[IU]/min via INTRAVENOUS

## 2017-02-28 MED ORDER — LACTATED RINGERS IV SOLN
INTRAVENOUS | Status: DC
  Administered 2017-02-28: 17:00:00 via INTRAVENOUS

## 2017-02-28 MED ORDER — LACTATED RINGERS IV SOLN: INTRAVENOUS | Status: DC

## 2017-02-28 MED ORDER — OXYCODONE-ACETAMINOPHEN 5-325 MG PO TABS: 2.0000 | ORAL_TABLET | ORAL | Status: DC | PRN

## 2017-02-28 MED ORDER — OXYCODONE-ACETAMINOPHEN 5-325 MG PO TABS: 1.0000 | ORAL_TABLET | ORAL | Status: DC | PRN

## 2017-02-28 MED ORDER — LACTATED RINGERS IV SOLN: 500.0000 mL | INTRAVENOUS | Status: DC | PRN

## 2017-02-28 MED ORDER — OXYTOCIN 40 UNITS IN LACTATED RINGERS INFUSION - SIMPLE MED
2.5000 [IU]/h | INTRAVENOUS | Status: DC
  Filled 2017-02-28: qty 1000

## 2017-02-28 MED ORDER — LIDOCAINE HCL (PF) 1 % IJ SOLN
30.0000 mL | INTRAMUSCULAR | Status: DC | PRN
  Filled 2017-02-28: qty 30

## 2017-02-28 MED ORDER — SOD CITRATE-CITRIC ACID 500-334 MG/5ML PO SOLN: 30.0000 mL | ORAL | Status: DC | PRN

## 2017-02-28 MED ORDER — FENTANYL CITRATE (PF) 100 MCG/2ML IJ SOLN
100.0000 ug | INTRAMUSCULAR | Status: DC | PRN
  Administered 2017-02-28 (×2): 100 ug via INTRAVENOUS
  Filled 2017-02-28: qty 2

## 2017-02-28 MED ORDER — ACETAMINOPHEN 325 MG PO TABS: 650.0000 mg | ORAL_TABLET | ORAL | Status: DC | PRN

## 2017-02-28 MED ORDER — OXYTOCIN BOLUS FROM INFUSION
500.0000 mL | Freq: Once | INTRAVENOUS | Status: AC
  Administered 2017-03-01: 500 mL via INTRAVENOUS

## 2017-02-28 MED ORDER — TERBUTALINE SULFATE 1 MG/ML IJ SOLN
0.2500 mg | Freq: Once | INTRAMUSCULAR | Status: DC | PRN
  Filled 2017-02-28: qty 1

## 2017-02-28 MED ORDER — FENTANYL CITRATE (PF) 100 MCG/2ML IJ SOLN
INTRAMUSCULAR | Status: AC
  Filled 2017-02-28: qty 2

## 2017-02-28 MED ORDER — ONDANSETRON HCL 4 MG/2ML IJ SOLN: 4.0000 mg | Freq: Four times a day (QID) | INTRAMUSCULAR | Status: DC | PRN

## 2017-02-28 NOTE — MAU Note (Signed)
Patient c/o leaking clear fluid since 5:56 this am. +contractions. Denies VB at this time. +FM

## 2017-02-28 NOTE — Progress Notes (Signed)
Patient ID: Rachael Espinoza, female   DOB: [birth year], 21 y.o.   MRN: [id number]  Pt resting, not disturbed  BP 134/74, other VSS FHR 120s, some 10x10accels, slight early variables w/ ctx Ctx q 2-4 mins w/ Pit @ 33mu/min  IUP@term  PROM Cx favorable Chiari malformation  Will titrate Pit to keep ctx reg Anticipate SVD Pt does not want epidural  Cam Hai CNM 02/28/2017 10:33 PM

## 2017-02-28 NOTE — H&P (Signed)
LABOR AND DELIVERY ADMISSION HISTORY AND PHYSICAL NOTE  Wilmetta Speiser Peatross is a 21 y.o. female G2P1001 with IUP at [redacted]w[redacted]d by LMP c/w [place] presenting for SOL/SROM.   Patient states she was heading to work when she noticed large amounts of fluid leaking, and constantly leaking, around 4-6PM. Shortly after she started having regular contractions. She reports positive fetal movement. She denies  vaginal bleeding.  Prenatal History/Complications:  Chiari malformation and carotid (behind Rt eye) aneurysm ([facility]y said OK for SVD).  Past Medical History: Past Medical History:  Diagnosis Date  . Chiari malformation   . Contraceptive management 08/25/2014  . Headache(784.0)   . Intracranial aneurysm   . Pinched nerve   . Supervision of normal pregnancy 08/13/2016    [facility] at   10 weeks FOB  Lacey Primus 20 yo HM 2nd child Dating By   LMP and [place] Pap  08/13/16  GC/CT Initial:                36+wks: Genetic Screen NT/IT:  CF screen  Anatomic [place]  Flu vaccine  Tdap Recommended ~ 28wks Glucose Screen  2 hr GBS  Feed Preference  Contraception  Circumcision  Childbirth Classes  Pediatrician    . Vasovagal syncope     Past Surgical History: Past Surgical History:  Procedure Laterality Date  . NO PAST SURGERIES      Obstetrical History: OB History    Gravida Para Term Preterm AB Living   SAB TAB Ectopic Multiple Live Births           1      Social History: Social History   Social History  . Marital status: Single    Spouse name: N/A  . Number of children: N/A  . Years of education: N/A   Social History Main Topics  . Smoking status: Former Smoker    Packs/day: 0.00    Years: 7.00    Types: Cigarettes  . Smokeless tobacco: Never Used  . Alcohol use No     Comment: not now  . Drug use: No     Comment: "pot in past"  . Sexual activity: Yes    Birth control/ protection: None   Other Topics Concern  . None   Social History Narrative   . None    Family History: Family History  Problem Relation Age of Onset  . Other Maternal Grandmother     cushing's  . Cancer Father     lung  . Hypertension Mother   . Thyroid disease Mother   . Cancer Maternal Aunt     cervical cancer  . Cancer Other     cervical; maternal great grandma  . Heart disease Other   . Cancer Maternal Grandfather     skin  . Cancer Other     renal cell    Allergies: No Known Allergies  Prescriptions Prior to Admission  Medication Sig Dispense Refill Last Dose  . IRON PO Take by mouth. Takes 5 daily   Past Week at Unknown time  . Prenatal Vit-Fe Fumarate-FA (PRENATAL VITAMIN PO) Take by mouth daily.   02/27/2017 at Unknown time  . [DISCONTINUED] Docusate Calcium (STOOL SOFTENER PO) Take by mouth as needed.    Not Taking     Review of Systems   All systems reviewed and negative except as stated in HPI  Physical Exam Blood pressure 134/74, pulse 89, temperature  98.9 F (37.2 C), temperature source Oral, resp. rate 18, height  (1.626 m), weight 158 lb 6.4 oz (71.8 kg), last menstrual period 06/04/2016, SpO2 100 %. General appearance: alert, cooperative, appears stated age and no distress Lungs: clear to auscultation bilaterally Heart: regular rate and rhythm Abdomen: soft, non-tender; bowel sounds normal Extremities: No calf swelling or tenderness Presentation: cephalic Fetal monitoring: 140 bpm, mod var, +accels, no decels Uterine activity: q2-53min     Prenatal labs: ABO, Rh: O/Negative/-- (10/03 1140) Antibody: Negative (02/08 0914) Rubella: !Error!  RPR: Non Reactive (02/08 0914)  HBsAg: Negative (10/03 1140)  HIV: Non Reactive (02/08 0914)  GBS: Negative (04/12 1430)  2 hr Glucola: 81/137/110 normal Genetic screening:  NEG Anatomy US: Resolved LT CP Cyst, normal, female  Prenatal Transfer Tool  Maternal Diabetes: No Genetic Screening: Normal Maternal Ultrasounds/Referrals: Normal Fetal Ultrasounds or other  Referrals:  None Maternal Substance Abuse:  No Significant Maternal Medications:  None Significant Maternal Lab Results: Lab values include: Group B Strep negative, Rh negative  Results for orders placed or performed during the hospital encounter of 02/28/17 (from the past 24 hour(s))  POCT fern test   Collection Time: 02/28/17  4:42 PM  Result Value Ref Range   POCT Fern Test      Patient Active Problem List   Diagnosis Date Noted  . Pregnancy 02/28/2017  . Vulvar lesions 02/12/2017  . Rh negative state in antepartum period 08/27/2016  . Supervision of normal pregnancy 08/13/2016  . Aneurysm (HCC) 03/22/2014  . Chiari malformation type I (HCC) 03/22/2014  . Vasovagal syncope 03/22/2014  . Smoker 03/22/2014    Assessment: Myla L Sime is a 21 y.o. G2P1001 at [redacted]w[redacted]d here for SOL/SROM  #Labor:SOL/SROM, currently contracting on own, will recheck in 1-2 hrs, if no change will augment #Pain: IV pain meds prn, refusing epidural (h/o chiari malformation and carotid aneurysm) #FWB: CAT I #ID:  GBS Neg;  Rh NEG (Rhogam PP) #MOF: Breast #MOC:IUD #Circ:  N/A #Chiari malformation and carotid aneurysm: Per [facility], OK for attempting SVD.  Jen Mow, DO [facility], [facility] 02/28/2017, 6:07 PM

## 2017-02-28 NOTE — Progress Notes (Addendum)
G2P1 @ 38+[redacted] wksga. Presents to triage for r/o SROM @ 04-05 this morning. Cont to leak. Denies bleeding. + FM. EFM applied.   1630: SVE: 1.5/40/ballotable. coupious amount of fluid on gloves. Fern test done.   1640: fern +  1642: MD notified. Report status of pt given. Orders received to start admission process and orders.   1700: labs and IV started with LR infusing.  1707: Pt to birthing 162 via wheelchair.

## 2017-03-01 ENCOUNTER — Encounter (HOSPITAL_COMMUNITY): Payer: Self-pay

## 2017-03-01 DIAGNOSIS — Z3A38 38 weeks gestation of pregnancy: Secondary | ICD-10-CM

## 2017-03-01 LAB — RPR: RPR Ser Ql: NONREACTIVE

## 2017-03-01 MED ORDER — PRENATAL MULTIVITAMIN CH
1.0000 | ORAL_TABLET | Freq: Every day | ORAL | Status: DC
  Administered 2017-03-02: 1 via ORAL
  Filled 2017-03-01: qty 1

## 2017-03-01 MED ORDER — METHYLERGONOVINE MALEATE 0.2 MG PO TABS: 0.2000 mg | ORAL_TABLET | ORAL | Status: DC | PRN

## 2017-03-01 MED ORDER — CALCIUM CARBONATE ANTACID 500 MG PO CHEW
1.0000 | CHEWABLE_TABLET | Freq: Once | ORAL | Status: AC
  Administered 2017-03-01: 200 mg via ORAL
  Filled 2017-03-01: qty 1

## 2017-03-01 MED ORDER — DIPHENHYDRAMINE HCL 25 MG PO CAPS: 25.0000 mg | ORAL_CAPSULE | Freq: Four times a day (QID) | ORAL | Status: DC | PRN

## 2017-03-01 MED ORDER — DIBUCAINE 1 % RE OINT: 1.0000 "application " | TOPICAL_OINTMENT | RECTAL | Status: DC | PRN

## 2017-03-01 MED ORDER — TETANUS-DIPHTH-ACELL PERTUSSIS 5-2.5-18.5 LF-MCG/0.5 IM SUSP
0.5000 mL | Freq: Once | INTRAMUSCULAR | Status: AC
  Administered 2017-03-02: 0.5 mL via INTRAMUSCULAR
  Filled 2017-03-01: qty 0.5

## 2017-03-01 MED ORDER — RHO D IMMUNE GLOBULIN 1500 UNIT/2ML IJ SOSY
300.0000 ug | PREFILLED_SYRINGE | Freq: Once | INTRAMUSCULAR | Status: AC
  Administered 2017-03-01: 300 ug via INTRAVENOUS
  Filled 2017-03-01: qty 2

## 2017-03-01 MED ORDER — ZOLPIDEM TARTRATE 5 MG PO TABS: 5.0000 mg | ORAL_TABLET | Freq: Every evening | ORAL | Status: DC | PRN

## 2017-03-01 MED ORDER — ACETAMINOPHEN 325 MG PO TABS: 650.0000 mg | ORAL_TABLET | ORAL | Status: DC | PRN

## 2017-03-01 MED ORDER — METHYLERGONOVINE MALEATE 0.2 MG/ML IJ SOLN: 0.2000 mg | INTRAMUSCULAR | Status: DC | PRN

## 2017-03-01 MED ORDER — MEASLES, MUMPS & RUBELLA VAC ~~LOC~~ INJ
0.5000 mL | INJECTION | Freq: Once | SUBCUTANEOUS | Status: DC
  Filled 2017-03-01: qty 0.5

## 2017-03-01 MED ORDER — WITCH HAZEL-GLYCERIN EX PADS: 1.0000 "application " | MEDICATED_PAD | CUTANEOUS | Status: DC | PRN

## 2017-03-01 MED ORDER — IBUPROFEN 600 MG PO TABS
600.0000 mg | ORAL_TABLET | Freq: Four times a day (QID) | ORAL | Status: DC
  Administered 2017-03-01 – 2017-03-02 (×6): 600 mg via ORAL
  Filled 2017-03-01 (×6): qty 1

## 2017-03-01 MED ORDER — COCONUT OIL OIL
1.0000 "application " | TOPICAL_OIL | Status: DC | PRN
  Administered 2017-03-02: 1 via TOPICAL
  Filled 2017-03-01: qty 120

## 2017-03-01 MED ORDER — OXYCODONE HCL 5 MG PO TABS: 5.0000 mg | ORAL_TABLET | ORAL | Status: DC | PRN

## 2017-03-01 MED ORDER — ONDANSETRON HCL 4 MG PO TABS
4.0000 mg | ORAL_TABLET | ORAL | Status: DC | PRN
  Administered 2017-03-01: 4 mg via ORAL
  Filled 2017-03-01: qty 1

## 2017-03-01 MED ORDER — ONDANSETRON HCL 4 MG/2ML IJ SOLN: 4.0000 mg | INTRAMUSCULAR | Status: DC | PRN

## 2017-03-01 MED ORDER — BENZOCAINE-MENTHOL 20-0.5 % EX AERO: 1.0000 "application " | INHALATION_SPRAY | CUTANEOUS | Status: DC | PRN

## 2017-03-01 MED ORDER — OXYCODONE HCL 5 MG PO TABS: 10.0000 mg | ORAL_TABLET | ORAL | Status: DC | PRN

## 2017-03-01 MED ORDER — SIMETHICONE 80 MG PO CHEW: 80.0000 mg | CHEWABLE_TABLET | ORAL | Status: DC | PRN

## 2017-03-01 MED ORDER — SENNOSIDES-DOCUSATE SODIUM 8.6-50 MG PO TABS
2.0000 | ORAL_TABLET | ORAL | Status: DC
  Administered 2017-03-02: 2 via ORAL
  Filled 2017-03-01: qty 2

## 2017-03-01 NOTE — Lactation Note (Signed)
This note was copied from a baby's chart. Lactation Consultation Note:   Mother is experienced with breastfeeding her first child for one year. Mother reports that her infant is breastfeeding well. Mother reports that her mother is a [facility] and is a good support for her. Mother denies having any concerns or questions. Mother was advised to cue base feed and feed infant 8-12 times in 24 hours. Mother was given Lactation Brochure given with information on all LC services.   Patient Name: Rachael Espinoza ZOXWR'U Date: 03/01/2017 Reason for consult: Initial assessment   Maternal Data    Feeding Feeding Type: Breast Fed Length of feed: 20 min  LATCH Score/Interventions Latch: Grasps breast easily, tongue down, lips flanged, rhythmical sucking.  Audible Swallowing: None  Type of Nipple: Everted at rest and after stimulation  Comfort (Breast/Nipple): Soft / non-tender     Hold (Positioning): No assistance needed to correctly position infant at breast.  LATCH Score: 8  Lactation Tools Discussed/Used     Consult Status Consult Status: Follow-up Date: 03/01/17 Follow-up type: In-patient    Stevan Born [facility] 03/01/2017, 2:12 PM

## 2017-03-01 NOTE — Progress Notes (Signed)
Patient ID: Rachael Espinoza, female   DOB: 1996-10-06, 21 y.o.   MRN: [id number]  Pt w/ urge to bear down FHR 100-115s, +accels Ctx q 3 min w/ Pit @ 36mu/min Cx C/C/+1  IUP@term  End 1st stage  Will begin pushing w/ ctx Anticipate SVD  Rachael Espinoza, [facility] 03/01/2017 12:28 AM

## 2017-03-02 LAB — RH IG WORKUP (INCLUDES ABO/RH)
ABO/RH(D): O NEG
FETAL SCREEN: NEGATIVE
Gestational Age(Wks): 38
UNIT DIVISION: 0

## 2017-03-02 MED ORDER — DOCUSATE SODIUM 100 MG PO CAPS: 100.0000 mg | ORAL_CAPSULE | Freq: Two times a day (BID) | ORAL | 0 refills | Status: DC

## 2017-03-02 MED ORDER — IBUPROFEN 600 MG PO TABS: 600.0000 mg | ORAL_TABLET | Freq: Four times a day (QID) | ORAL | 0 refills | Status: DC

## 2017-03-02 NOTE — Discharge Summary (Signed)
OB Discharge Summary     Patient Name: Rachael Espinoza DOB: 11/04/1996 MRN: [id number]  Date of admission: 02/28/2017 Delivering MD: Cam Hai D   Date of discharge: 03/02/2017  Admitting diagnosis: 38w  ctx 8-10 min, pressure Intrauterine pregnancy: [redacted]w[redacted]d     Secondary diagnosis:  Active Problems:   Aneurysm (HCC)   Chiari malformation type I (HCC)   Rh negative state in antepartum period   Pregnancy   NSVD (normal spontaneous vaginal delivery)  Additional problems: None     Discharge diagnosis: Term Pregnancy Delivered                                                                                                Post partum procedures:Rhogam and TdaP  Augmentation: Pitocin  Complications: None  Hospital course:  Onset of Labor With Vaginal Delivery     21 y.o. yo [id number] at [redacted]w[redacted]d was admitted in Active Labor on 02/28/2017. Patient had an uncomplicated labor course as follows:  Membrane Rupture Time/Date: 5:00 AM ,03/01/2017   Intrapartum Procedures: Episiotomy: None [1]                                         Lacerations:  None [1]  Patient had a delivery of a Viable infant. 03/01/2017  Information for the patient's newborn:  Cree, [place] Girl Cambelle [[id number]]  Delivery Method: Vaginal, Spontaneous Delivery (Filed from Delivery Summary)    Pateint had an uncomplicated postpartum course.  She is ambulating, tolerating a regular diet, passing flatus, and urinating well. Patient is discharged home in stable condition on 03/02/17.   Physical exam  Vitals:   03/01/17 0332 03/01/17 0827 03/01/17 1627 03/02/17 0625  BP: 137/77 (!) 141/55 130/69 109/65  Pulse: 88 91 (!) 103 80  Resp: Temp: 97.8 F (36.6 C) 98.2 F (36.8 C) 98.4 F (36.9 C) 97.8 F (36.6 C)  TempSrc: Oral Oral Oral   SpO2: 100%  99%   Weight:      Height:       General: alert, cooperative and no distress Lochia: appropriate Uterine Fundus: firm Incision: N/A DVT Evaluation: No  evidence of DVT seen on physical exam. Negative Homan's sign. No cords or calf tenderness. Labs: Lab Results  Component Value Date   WBC 14.4 (H) 02/28/2017   HGB 9.6 (L) 02/28/2017   HCT 30.0 (L) 02/28/2017   MCV 85.5 02/28/2017   PLT 258 02/28/2017   No flowsheet data found.  Discharge instruction: per After Visit Summary and "Baby and Me Booklet".  After visit meds:  Allergies as of 03/02/2017   No Known Allergies     Medication List    TAKE these medications   docusate sodium 100 MG capsule Commonly known as:  COLACE Take 1 capsule (100 mg total) by mouth 2 (two) times daily.   ibuprofen 600 MG tablet Commonly known as:  ADVIL,MOTRIN Take 1 tablet (600 mg total) by mouth every 6 (six) hours.   IRON PO  Take by mouth. Takes 5 daily   PRENATAL VITAMIN PO Take by mouth daily.       Diet: routine diet  Activity: Advance as tolerated. Pelvic rest for 6 weeks.   Outpatient follow up:6 weeks Follow up Appt:Future Appointments Date Time Provider Department Center  03/06/2017 1:30 PM Jacklyn Shell, CNM [facility]   Follow up Visit:No Follow-up on file.  Postpartum contraception: IUD Mirena  Newborn Data: Live born female  Birth Weight: 7 lb 7.4 oz (3385 g) APGAR: 8, 9  Baby Feeding: Breast Disposition:home with mother   03/02/2017 Jen Mow, DO OB Fellow

## 2017-03-02 NOTE — Lactation Note (Signed)
This note was copied from a baby's chart. Lactation Consultation Note  Patient Name: Rachael Espinoza ZOXWR'U Date: 03/02/2017 Reason for consult: Follow-up assessment Baby at 35 hr of life. Dyad set for D/C today. Mom reports baby is latching well. She is reporting mild nipple soreness. No skin breast down or bruising noted, coconut oil given. Parents voiced no concerns. Discussed baby behavior, feeding frequency, voids, wt loss, breast changes, and nipple care. Mom has DEBP at home. Parents are aware of lactation services and support group. They will call as needed.    Maternal Data    Feeding Feeding Type: Breast Fed Length of feed: 15 min  LATCH Score/Interventions Latch: Grasps breast easily, tongue down, lips flanged, rhythmical sucking.  Audible Swallowing: Spontaneous and intermittent  Type of Nipple: Everted at rest and after stimulation  Comfort (Breast/Nipple): Soft / non-tender     Hold (Positioning): No assistance needed to correctly position infant at breast.  LATCH Score: 10  Lactation Tools Discussed/Used     Consult Status Consult Status: Complete    Rulon Eisenmenger 03/02/2017, 11:46 AM

## 2017-03-02 NOTE — Discharge Instructions (Signed)

## 2017-03-03 ENCOUNTER — Telehealth: Payer: Self-pay | Admitting: Obstetrics & Gynecology

## 2017-03-03 NOTE — Telephone Encounter (Signed)
Pt called stating that she has delivered her baby over the weekend and would like to know if the provider could call her in amedication for pain. Please contact pt

## 2017-03-03 NOTE — Telephone Encounter (Signed)
Patient called requesting pain meds. Advised patient that vaginal deliveries, are usually only prescribed ibuprofen. Pt stated she did have a prescription. I informed patient that ibuprofen scrip was sent to [facility] in [place]. Pt verbalized understanding.

## 2017-03-04 LAB — TYPE AND SCREEN
ABO/RH(D): O NEG
Antibody Screen: POSITIVE
DAT, IgG: NEGATIVE
UNIT DIVISION: 0
UNIT DIVISION: 0

## 2017-03-04 LAB — BPAM RBC
Blood Product Expiration Date: 201805162359
Blood Product Expiration Date: 201805162359
Unit Type and Rh: 9500
Unit Type and Rh: 9500

## 2017-03-06 ENCOUNTER — Encounter: Payer: Medicaid Other | Admitting: Advanced Practice Midwife

## 2017-04-03 ENCOUNTER — Encounter: Payer: Self-pay | Admitting: Women's Health

## 2017-04-03 ENCOUNTER — Ambulatory Visit (INDEPENDENT_AMBULATORY_CARE_PROVIDER_SITE_OTHER): Payer: Medicaid Other | Admitting: Women's Health

## 2017-04-03 NOTE — Patient Instructions (Signed)
NO SEX UNTIL AFTER YOU GET YOUR BIRTH CONTROL   Levonorgestrel intrauterine device (IUD) What is this medicine? LEVONORGESTREL IUD (LEE voe nor jes trel) is a contraceptive (birth control) device. The device is placed inside the uterus by a healthcare professional. It is used to prevent pregnancy. This device can also be used to treat heavy bleeding that occurs during your period. This medicine may be used for other purposes; ask your health care provider or pharmacist if you have questions. COMMON BRAND NAME(S): Kyleena, LILETTA, Mirena, Skyla What should I tell my health care provider before I take this medicine? They need to know if you have any of these conditions: -abnormal Pap smear -cancer of the breast, uterus, or cervix -diabetes -endometritis -genital or pelvic infection now or in the past -have more than one sexual partner or your partner has more than one partner -heart disease -history of an ectopic or tubal pregnancy -immune system problems -IUD in place -liver disease or tumor -problems with blood clots or take blood-thinners -seizures -use intravenous drugs -uterus of unusual shape -vaginal bleeding that has not been explained -an unusual or allergic reaction to levonorgestrel, other hormones, silicone, or polyethylene, medicines, foods, dyes, or preservatives -pregnant or trying to get pregnant -breast-feeding How should I use this medicine? This device is placed inside the uterus by a health care professional. Talk to your pediatrician regarding the use of this medicine in children. Special care may be needed. Overdosage: If you think you have taken too much of this medicine contact a poison control center or emergency room at once. NOTE: This medicine is only for you. Do not share this medicine with others. What if I miss a dose? This does not apply. Depending on the brand of device you have inserted, the device will need to be replaced every 3 to 5 years if you  wish to continue using this type of birth control. What may interact with this medicine? Do not take this medicine with any of the following medications: -amprenavir -bosentan -fosamprenavir This medicine may also interact with the following medications: -aprepitant -armodafinil -barbiturate medicines for inducing sleep or treating seizures -bexarotene -boceprevir -griseofulvin -medicines to treat seizures like carbamazepine, ethotoin, felbamate, oxcarbazepine, phenytoin, topiramate -modafinil -pioglitazone -rifabutin -rifampin -rifapentine -some medicines to treat HIV infection like atazanavir, efavirenz, indinavir, lopinavir, nelfinavir, tipranavir, ritonavir -St. John's wort -warfarin This list may not describe all possible interactions. Give your health care provider a list of all the medicines, herbs, non-prescription drugs, or dietary supplements you use. Also tell them if you smoke, drink alcohol, or use illegal drugs. Some items may interact with your medicine. What should I watch for while using this medicine? Visit your doctor or health care professional for regular check ups. See your doctor if you or your partner has sexual contact with others, becomes HIV positive, or gets a sexual transmitted disease. This product does not protect you against HIV infection (AIDS) or other sexually transmitted diseases. You can check the placement of the IUD yourself by reaching up to the top of your vagina with clean fingers to feel the threads. Do not pull on the threads. It is a good habit to check placement after each menstrual period. Call your doctor right away if you feel more of the IUD than just the threads or if you cannot feel the threads at all. The IUD may come out by itself. You may become pregnant if the device comes out. If you notice that the IUD has come out   use a backup birth control method like condoms and call your health care provider. Using tampons will not change the  position of the IUD and are okay to use during your period. This IUD can be safely scanned with magnetic resonance imaging (MRI) only under specific conditions. Before you have an MRI, tell your healthcare provider that you have an IUD in place, and which type of IUD you have in place. What side effects may I notice from receiving this medicine? Side effects that you should report to your doctor or health care professional as soon as possible: -allergic reactions like skin rash, itching or hives, swelling of the face, lips, or tongue -fever, flu-like symptoms -genital sores -high blood pressure -no menstrual period for 6 weeks during use -pain, swelling, warmth in the leg -pelvic pain or tenderness -severe or sudden headache -signs of pregnancy -stomach cramping -sudden shortness of breath -trouble with balance, talking, or walking -unusual vaginal bleeding, discharge -yellowing of the eyes or skin Side effects that usually do not require medical attention (report to your doctor or health care professional if they continue or are bothersome): -acne -breast pain -change in sex drive or performance -changes in weight -cramping, dizziness, or faintness while the device is being inserted -headache -irregular menstrual bleeding within first 3 to 6 months of use -nausea This list may not describe all possible side effects. Call your doctor for medical advice about side effects. You may report side effects to FDA at 1-800-FDA-1088. Where should I keep my medicine? This does not apply. NOTE: This sheet is a summary. It may not cover all possible information. If you have questions about this medicine, talk to your doctor, pharmacist, or health care provider.  2018 Elsevier/Gold Standard (2016-08-09 14:14:56)  

## 2017-04-03 NOTE — Progress Notes (Signed)
Subjective:    Rachael PinchChelsea L Espinoza is a 21 y.o. [id number] Caucasian female who presents for a postpartum visit. She is 4 weeks postpartum following a spontaneous vaginal delivery at 38.4 gestational weeks. Anesthesia: IV sedation. I have fully reviewed the prenatal and intrapartum course. Postpartum course has been uncomplicated. Baby's course has been uncomplicated. Baby is feeding by breast. Bleeding thin lochia. Bowel function is normal. Bladder function is normal. Patient is sexually active. Last sexual activity: last night. Contraception method is none and wants IUD. Postpartum depression screening: negative. Score 0.  Last pap 08/13/16 and was neg.  The following portions of the patient's history were reviewed and updated as appropriate: allergies, current medications, past medical history, past surgical history and problem list.  Review of Systems Pertinent items are noted in HPI.   Vitals:   04/03/17 1221  BP: 124/78  Pulse: 80  Weight: 139 lb (63 kg)  Height: 5\' 4"  (1.626 m)   Patient's last menstrual period was 06/04/2016 (exact date).  Objective:   General:  alert, cooperative and no distress   Breasts:  deferred, no complaints  Lungs: clear to auscultation bilaterally  Heart:  regular rate and rhythm  Abdomen: soft, nontender   Vulva: normal  Vagina: normal vagina  Cervix:  closed  Corpus: Well-involuted  Adnexa:  Non-palpable  Rectal Exam: No hemorrhoids        Assessment:   Postpartum exam 4 wks s/p SVB Breastfeeding Depression screening Contraception counseling   Plan:  Contraception: abstinence until IUD insertion Follow up in: on 6/6 (14d from last sex) for IUD insertion, or earlier if needed  Marge DuncansBooker, Kimberly Randall CNM, [facility] 04/03/2017 12:25 PM

## 2017-04-17 ENCOUNTER — Other Ambulatory Visit: Payer: Medicaid Other

## 2017-04-17 ENCOUNTER — Ambulatory Visit (INDEPENDENT_AMBULATORY_CARE_PROVIDER_SITE_OTHER): Payer: Medicaid Other | Admitting: Women's Health

## 2017-04-17 DIAGNOSIS — Z3009 Encounter for other general counseling and advice on contraception: Secondary | ICD-10-CM

## 2017-04-17 DIAGNOSIS — Z975 Presence of (intrauterine) contraceptive device: Secondary | ICD-10-CM

## 2017-04-17 LAB — BETA HCG QUANT (REF LAB)

## 2017-04-17 NOTE — Progress Notes (Signed)
Pt not seen by provider. Changed mind and wanted Paragard- need to order. IUD insertion visit rescheduled. No sex until insertion.  Nakaya Mishkin R. Lionell Matuszak, CNM, [facility]-BC 04/17/2017 4:52 PM

## 2017-05-08 ENCOUNTER — Ambulatory Visit (INDEPENDENT_AMBULATORY_CARE_PROVIDER_SITE_OTHER): Payer: Medicaid Other | Admitting: Women's Health

## 2017-05-08 ENCOUNTER — Encounter: Payer: Self-pay | Admitting: Women's Health

## 2017-05-08 VITALS — BP 120/68 | HR 96 | Wt 141.0 lb

## 2017-05-08 DIAGNOSIS — Z30430 Encounter for insertion of intrauterine contraceptive device: Secondary | ICD-10-CM | POA: Diagnosis not present

## 2017-05-08 DIAGNOSIS — Z3202 Encounter for pregnancy test, result negative: Secondary | ICD-10-CM | POA: Diagnosis not present

## 2017-05-08 LAB — POCT URINE PREGNANCY: Preg Test, Ur: NEGATIVE

## 2017-05-08 NOTE — Patient Instructions (Signed)
 Nothing in vagina for 3 days (no sex, douching, tampons, etc...)  Check your strings once a month to make sure you can feel them, if you are not able to please let us know  If you develop a fever of 100.4 or more in the next few weeks, or if you develop severe abdominal pain, please let us know  Use a backup method of birth control, such as condoms, for 2 weeks    Intrauterine Device Insertion, Care After This sheet gives you information about how to care for yourself after your procedure. Your health care provider may also give you more specific instructions. If you have problems or questions, contact your health care provider. What can I expect after the procedure? After the procedure, it is common to have:  Cramps and pain in the abdomen.  Light bleeding (spotting) or heavier bleeding that is like your menstrual period. This may last for up to a few days.  Lower back pain.  Dizziness.  Headaches.  Nausea.  Follow these instructions at home:  Before resuming sexual activity, check to make sure that you can feel the IUD string(s). You should be able to feel the end of the string(s) below the opening of your cervix. If your IUD string is in place, you may resume sexual activity. ? If you had a hormonal IUD inserted more than 7 days after your most recent period started, you will need to use a backup method of birth control for 7 days after IUD insertion. Ask your health care provider whether this applies to you.  Continue to check that the IUD is still in place by feeling for the string(s) after every menstrual period, or once a month.  Take over-the-counter and prescription medicines only as told by your health care provider.  Do not drive or use heavy machinery while taking prescription pain medicine.  Keep all follow-up visits as told by your health care provider. This is important. Contact a health care provider if:  You have bleeding that is heavier or lasts longer than  a normal menstrual cycle.  You have a fever.  You have cramps or abdominal pain that get worse or do not get better with medicine.  You develop abdominal pain that is new or is not in the same area of earlier cramping and pain.  You feel lightheaded or weak.  You have abnormal or bad-smelling discharge from your vagina.  You have pain during sexual activity.  You have any of the following problems with your IUD string(s): ? The string bothers or hurts you or your sexual partner. ? You cannot feel the string. ? The string has gotten longer.  You can feel the IUD in your vagina.  You think you may be pregnant, or you miss your menstrual period.  You think you may have an STI (sexually transmitted infection). Get help right away if:  You have flu-like symptoms.  You have a fever and chills.  You can feel that your IUD has slipped out of place. Summary  After the procedure, it is common to have cramps and pain in the abdomen. It is also common to have light bleeding (spotting) or heavier bleeding that is like your menstrual period.  Continue to check that the IUD is still in place by feeling for the string(s) after every menstrual period, or once a month.  Keep all follow-up visits as told by your health care provider. This is important.  Contact your health care provider if   you have problems with your IUD string(s), such as the string getting longer or bothering you or your sexual partner. This information is not intended to replace advice given to you by your health care provider. Make sure you discuss any questions you have with your health care provider. Document Released: 06/26/2011 Document Revised: 09/18/2016 Document Reviewed: 09/18/2016 Elsevier Interactive Patient Education  2017 Elsevier Inc.  

## 2017-05-08 NOTE — Progress Notes (Addendum)
Rachael Espinoza is a 21 y.o. year old [id number] Caucasian female who presents for placement of a Paragard IUD.  No LMP recorded. BP 120/68 (BP Location: Right Arm, Patient Position: Sitting, Cuff Size: Normal)   Pulse 96   Wt 141 lb (64 kg)   Breastfeeding? Yes   BMI 24.20 kg/m  Last sexual intercourse was >2wks ago, and pregnancy test today was neg  Results for orders placed or performed in visit on 05/08/17 (from the past 24 hour(s))  POCT urine pregnancy     Status: None   Collection Time: 05/08/17 11:19 AM  Result Value Ref Range   Preg Test, Ur Negative Negative    The risks and benefits of the method and placement have been thouroughly reviewed with the patient and all questions were answered.  Specifically the patient is aware of failure rate of 11/998, expulsion of the IUD and of possible perforation.  The patient is aware of irregular bleeding due to the method and understands the incidence of irregular bleeding diminishes with time.  Signed copy of informed consent in chart.   Time out was performed.  A graves speculum was placed in the vagina.  The cervix was visualized, prepped using Betadine, and grasped with a single tooth tenaculum. The uterus was found to be retroflexed and it sounded to 9 cm.  Paragard IUD placed per manufacturer's recommendations.   The strings were trimmed to 3 cm.  Sonogram was performed and the proper placement of the IUD was verified via transvaginal u/s.   The patient was given post procedure instructions, including signs and symptoms of infection and to check for the strings after each menses or each month, and refraining from intercourse or anything in the vagina for 3 days.  She was given a Paragard care card with date IUD placed, and date IUD to be removed.  She is scheduled for a f/u appointment in 4 weeks.  Marge DuncansBooker, Lashon Beringer Randall CNM, [facility] 05/08/2017 11:54 AM

## 2017-06-05 ENCOUNTER — Ambulatory Visit: Payer: Medicaid Other | Admitting: Women's Health

## 2017-06-13 ENCOUNTER — Ambulatory Visit (INDEPENDENT_AMBULATORY_CARE_PROVIDER_SITE_OTHER): Payer: Medicaid Other | Admitting: Women's Health

## 2017-06-13 ENCOUNTER — Encounter: Payer: Self-pay | Admitting: Women's Health

## 2017-06-13 VITALS — BP 102/58 | HR 78 | Ht 64.0 in | Wt 145.5 lb

## 2017-06-13 DIAGNOSIS — Z30431 Encounter for routine checking of intrauterine contraceptive device: Secondary | ICD-10-CM | POA: Diagnosis not present

## 2017-06-13 NOTE — Progress Notes (Signed)
   [facility]  Patient name: Rachael Espinoza MRN [id number]  Date of birth: 04/27/1996  CC & HPI:  Rachael CaraChelsea L Pagliuca is a 21 y.o. 152P2002 Caucasian female presenting today for IUD f/u. Paragard IUD inserted 05/08/17. Able to feel strings, no pain w/ sex, bled x 1wk after insertion, none since. Does have an intermittent sharp pain LLQ that began during pregnancy. Happens maybe once/wk, last about 1 hour. Denies abnormal d/c, itching/odor/irritation, no h/o ovarian cysts. Review of sonos during pregnancy reveal no note of any ovarian cysts, etc.  Patient's last menstrual period was 05/08/2017. The current method of family planning is IUD. Last pap 08/13/16, neg  Pertinent History Reviewed:  Medical & Surgical Hx:   Past medical, surgical, family, and social history reviewed in electronic medical record Medications: Reviewed & Updated - see associated section Allergies: Reviewed in electronic medical record  Objective Findings:  Vitals: BP (!) 102/58 (BP Location: Left Arm, Patient Position: Sitting, Cuff Size: Normal)   Pulse 78   Ht 5\' 4"  (1.626 m)   Wt 145 lb 8 oz (66 kg)   LMP 05/08/2017   Breastfeeding? Yes   BMI 24.98 kg/m  Body mass index is 24.98 kg/m.  Physical Examination: General appearance - alert, well appearing, and in no distress Pelvic - Paragard IUD strings visible, tucked behind cx, normal non-odorous d/c  No results found for this or any previous visit (from the past 24 hour(s)).   Assessment & Plan:  A:   IUD check  LLQ pain  P:  Continue checking strings monthly, let us know if unable to feel  Monitor LLQ pain, if worsens let us know. Discussed FP [facility] does not cover pelvic u/s  Return for after 10/3 for physical.  Marge DuncansBooker, Amond Speranza Randall CNM, WHNP-BC 06/13/2017 11:59 AM

## 2017-09-02 ENCOUNTER — Encounter: Payer: Self-pay | Admitting: Family Medicine

## 2017-09-02 ENCOUNTER — Ambulatory Visit (INDEPENDENT_AMBULATORY_CARE_PROVIDER_SITE_OTHER): Payer: Self-pay | Admitting: Family Medicine

## 2017-09-02 VITALS — BP 110/68 | Ht 64.0 in | Wt 149.5 lb

## 2017-09-02 DIAGNOSIS — L989 Disorder of the skin and subcutaneous tissue, unspecified: Secondary | ICD-10-CM

## 2017-09-02 NOTE — Progress Notes (Signed)
   Subjective:    Patient ID: Rachael Espinoza, female    DOB: 05/27/1996, 21 y.o.   MRN: [id number]  HPI Patient has concerns of mole to upper back. Onset a few months ago.  Patient had a couple moles on her back that mother was concerned about severe for she came into be seen one is raises the other one is been changing colors there's been no other particular troubles no family history of any type of skin cancer no nausea vomiting or fever  Review of Systems     Objective:   Physical Exam  On her back she has multiple small moles on her top shoulders that are all normal-looking she has a raised mole in the middle of her back which is also normal-looking but another nevi close to it which has multiple colors and darkened a proximally 3 mm in diameter      Assessment & Plan:  Abnormal nevi recommend referral to dermatology for evaluation and removal avoid excessive sun

## 2017-09-02 NOTE — Patient Instructions (Signed)
We will set you up with Dr.Halls office

## 2017-09-09 ENCOUNTER — Encounter: Payer: Self-pay | Admitting: Family Medicine

## 2018-01-06 ENCOUNTER — Ambulatory Visit (INDEPENDENT_AMBULATORY_CARE_PROVIDER_SITE_OTHER): Payer: Self-pay | Admitting: Advanced Practice Midwife

## 2018-01-06 ENCOUNTER — Other Ambulatory Visit: Payer: Self-pay

## 2018-01-06 ENCOUNTER — Encounter: Payer: Self-pay | Admitting: Advanced Practice Midwife

## 2018-01-06 VITALS — BP 108/60 | HR 76 | Ht 64.0 in | Wt 141.0 lb

## 2018-01-06 DIAGNOSIS — Z30431 Encounter for routine checking of intrauterine contraceptive device: Secondary | ICD-10-CM

## 2018-01-06 DIAGNOSIS — R110 Nausea: Secondary | ICD-10-CM

## 2018-01-06 DIAGNOSIS — R102 Pelvic and perineal pain: Secondary | ICD-10-CM

## 2018-01-06 NOTE — Progress Notes (Signed)
History:  22 y.o. [id number] here today for today for IUD string check; paragard IUD was placed  05/08/17. C/O getting a big cramp out of the blue this am, lasted about an hour, felt nauseated too. Still has some mild discomfort, a little nauseated.  Finished period 2 days ago.   The following portions of the patient's history were reviewed and updated as appropriate: allergies, current medications, past family history, past medical history, past social history, past surgical history and problem list.  Review of Systems:   Constitutional: Negative for fever and chills Eyes: Negative for visual disturbances Respiratory: Negative for shortness of breath, dyspnea Cardiovascular: Negative for chest pain or palpitations  Gastrointestinal: Negative for vomiting, diarrhea and constipation Genitourinary: Negative for dysuria and urgency Musculoskeletal: Negative for back pain, joint pain, myalgias  Neurological: Negative for dizziness and headaches    Objective:  Physical Exam Blood pressure 108/60, pulse 76, height 5\' 4"  (1.626 m), weight 141 lb (64 kg), last menstrual period 12/30/2017, currently breastfeeding. Gen: NAD Abd: Soft, nontender and nondistended Pelvic: Bedside US reveals properly place IUD.   Assessment & Plan:  Normal IUD check. TIme will tell if cramps are r/t IUD. URine dipped neg.  F/U if pain persists and wants it removed

## 2018-02-19 ENCOUNTER — Other Ambulatory Visit: Payer: Self-pay | Admitting: Family Medicine

## 2018-02-19 ENCOUNTER — Encounter: Payer: Self-pay | Admitting: Family Medicine

## 2018-02-19 ENCOUNTER — Ambulatory Visit (INDEPENDENT_AMBULATORY_CARE_PROVIDER_SITE_OTHER): Payer: Self-pay | Admitting: Family Medicine

## 2018-02-19 VITALS — BP 108/72 | Temp 97.6°F | Ht 64.0 in | Wt 139.0 lb

## 2018-02-19 DIAGNOSIS — M549 Dorsalgia, unspecified: Secondary | ICD-10-CM

## 2018-02-19 DIAGNOSIS — M542 Cervicalgia: Secondary | ICD-10-CM

## 2018-02-19 DIAGNOSIS — S134XXD Sprain of ligaments of cervical spine, subsequent encounter: Secondary | ICD-10-CM

## 2018-02-19 MED ORDER — HYDROCODONE-ACETAMINOPHEN 5-325 MG PO TABS: 1.0000 | ORAL_TABLET | Freq: Four times a day (QID) | ORAL | 0 refills | Status: AC | PRN

## 2018-02-19 NOTE — Progress Notes (Signed)
Referral placed in Epic.
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
## 2018-02-20 ENCOUNTER — Ambulatory Visit: Payer: Medicaid Other | Admitting: Nurse Practitioner

## 2018-03-17 ENCOUNTER — Ambulatory Visit: Payer: Medicaid Other | Admitting: Family Medicine

## 2018-03-26 ENCOUNTER — Ambulatory Visit: Payer: Medicaid Other | Admitting: Family Medicine

## 2018-03-31 ENCOUNTER — Ambulatory Visit (INDEPENDENT_AMBULATORY_CARE_PROVIDER_SITE_OTHER): Payer: Self-pay | Admitting: Family Medicine

## 2018-03-31 ENCOUNTER — Encounter: Payer: Self-pay | Admitting: Family Medicine

## 2018-03-31 VITALS — BP 114/62 | Ht 64.0 in | Wt 135.0 lb

## 2018-03-31 DIAGNOSIS — L989 Disorder of the skin and subcutaneous tissue, unspecified: Secondary | ICD-10-CM

## 2018-03-31 DIAGNOSIS — M542 Cervicalgia: Secondary | ICD-10-CM
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
## 2018-04-07 ENCOUNTER — Encounter (INDEPENDENT_AMBULATORY_CARE_PROVIDER_SITE_OTHER): Payer: Self-pay

## 2018-04-07 ENCOUNTER — Encounter: Payer: Self-pay | Admitting: Family Medicine

## 2018-04-27 DIAGNOSIS — D225 Melanocytic nevi of trunk: Secondary | ICD-10-CM | POA: Diagnosis not present

## 2018-10-27 ENCOUNTER — Telehealth: Payer: Self-pay | Admitting: Family Medicine

## 2018-10-27 NOTE — Telephone Encounter (Signed)
Pt requesting shot record and for nurse to give her a call in regards of immunizations she's needing. Advise.

## 2018-10-28 NOTE — Telephone Encounter (Signed)
Pulled patients chart. Patient is up to date but needs adult Hep A. Pt contacted and informed that she was up to date but the Hep A would need to be received at the [facility]. Pt states she does not think she needs the Hep A. Pt states she needs this for school papers that she is filling out. Shot record and immunizations copied from paper chart and are up front awaiting pick up

## 2018-11-19 ENCOUNTER — Encounter: Payer: Medicaid Other | Admitting: Family Medicine

## 2018-11-23 ENCOUNTER — Telehealth: Payer: Self-pay | Admitting: Women's Health

## 2018-11-23 ENCOUNTER — Encounter: Payer: Self-pay | Admitting: Family Medicine

## 2018-11-23 ENCOUNTER — Ambulatory Visit (INDEPENDENT_AMBULATORY_CARE_PROVIDER_SITE_OTHER): Payer: Medicaid Other | Admitting: Family Medicine

## 2018-11-23 ENCOUNTER — Other Ambulatory Visit: Payer: Self-pay | Admitting: Family Medicine

## 2018-11-23 ENCOUNTER — Telehealth: Payer: Self-pay | Admitting: Family Medicine

## 2018-11-23 VITALS — BP 118/68 | HR 93 | Ht 64.0 in | Wt 139.0 lb

## 2018-11-23 DIAGNOSIS — Z119 Encounter for screening for infectious and parasitic diseases, unspecified: Secondary | ICD-10-CM

## 2018-11-23 DIAGNOSIS — Z0000 Encounter for general adult medical examination without abnormal findings: Secondary | ICD-10-CM | POA: Diagnosis not present

## 2018-11-23 DIAGNOSIS — H833X2 Noise effects on left inner ear: Secondary | ICD-10-CM

## 2018-11-23 NOTE — Telephone Encounter (Signed)
I had recommended the varicella titer. Can the health dept do that? If not, we can order it.

## 2018-11-23 NOTE — Telephone Encounter (Signed)
Pt requesting tieter test for school. Advise.

## 2018-11-23 NOTE — Telephone Encounter (Signed)
Pt states that health dept is not able to do the varicella shots. Pt states the titers can be drawn. Please advise. Thank you

## 2018-11-23 NOTE — Telephone Encounter (Signed)
Patient called, she has questions about injections, no depo.  [phone]

## 2018-11-23 NOTE — Telephone Encounter (Signed)
Spoke with pt. Pt states records indicate she needs another varicella vaccine. I advised we don't give varicella vaccines and to call PCP. Pt voiced understanding. JSY

## 2018-11-23 NOTE — Telephone Encounter (Signed)
Pt contacted to be informed that lab orders were placed. Pt states that the shot record she received today was wrong and the one she was given about a month has different dates. Pt states she is going to get handwritten shot record from mom where she has 2 wrong copies.

## 2018-11-23 NOTE — Telephone Encounter (Signed)
Patient called, stated she is needing a
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
## 2018-12-03 ENCOUNTER — Encounter: Payer: Self-pay | Admitting: Family Medicine

## 2019-04-19 DIAGNOSIS — Z20828 Contact with and (suspected) exposure to other viral communicable diseases: Secondary | ICD-10-CM | POA: Diagnosis not present

## 2019-07-07 ENCOUNTER — Ambulatory Visit: Payer: Medicaid Other | Admitting: Obstetrics and Gynecology

## 2019-07-07 ENCOUNTER — Encounter: Payer: Self-pay | Admitting: Obstetrics and Gynecology

## 2019-07-07 ENCOUNTER — Other Ambulatory Visit: Payer: Self-pay

## 2019-07-07 VITALS — BP 119/80 | HR 89 | Ht 64.0 in | Wt 139.8 lb

## 2019-07-07 DIAGNOSIS — R55 Syncope and collapse: Secondary | ICD-10-CM

## 2019-07-07 DIAGNOSIS — Z30432 Encounter for removal of intrauterine contraceptive device: Secondary | ICD-10-CM

## 2019-07-07 NOTE — Progress Notes (Signed)
Patient ID: Rachael Espinoza, female   DOB: 07/08/1996, 23 y.o.   MRN: [id number]  [facility] PROCEDURE NOTE  Rachael Espinoza is a 23 y.o. [phone] here for Copper Paraguard IUD removal. Is having pelvic pain with IUD periods are heavier, and dyspareunia. Wanted paraguard for non hormonal IUD, had Nexplanon but after a year bled for 2 months straight.  Last pap smear was normal  In 08/13/2016.  IUD Removal  Patient was in the dorsal lithotomy position, normal external genitalia was noted.  A speculum was placed in the patient's vagina, normal discharge was noted, no lesions. The multiparous cervix was visualized, no lesions, no abnormal discharge;  and the cervix was swabbed with Betadine using scopettes. The strings of the IUD were grasped and pulled using ring forceps.  The IUD was successfully removed in its entirety. Patient tolerated the procedure well.    Pt fainted in doctors office after procedure while speaking to him about alternative contraception. Patient was placed flat on the couch with pillow as head support, feet elevated by nurse. Cool cloth placed around neck.  Pt lives 20 minutes from office and was kept until 4 pm. After syncope episode BP 115/76 pulse 85 @ 3:30 pm. Second reading @ 3:55 p.m 110/73 pulse85  A:  1. 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 Dyspareunia 3. Pelvic pain 4. Syncope after IUD removal to vasovagal syncope with was known predisposition to syncope, last episode 4 years ago 5. History of vascular aneurysm behind right eye  P: 1. Future IUD/ BIRTHCONTROL contraception booklet information given for review. 2. PRN   By signing my name below, I, Samul Dada, attest that this documentation has been prepared under the direction and in the presence of Jonnie Kind, MD. Electronically Signed: [place]. 07/07/19. 3:02 PM.  I personally performed the services described in this documentation, which was SCRIBED in my presence. The recorded  information has been reviewed and considered accurate. It has been edited as necessary during review. Jonnie Kind, MD

## 2019-11-09 ENCOUNTER — Telehealth: Payer: Self-pay | Admitting: Family Medicine

## 2019-11-09 MED ORDER — BENZONATATE 100 MG PO CAPS: 100.0000 mg | ORAL_CAPSULE | Freq: Three times a day (TID) | ORAL | 2 refills | Status: DC | PRN

## 2019-11-09 NOTE — Telephone Encounter (Signed)
Prescription sent electronically to pharmacy. Patient notified. 

## 2019-11-09 NOTE — Telephone Encounter (Signed)
Patient would like to try the tessalon perles

## 2019-11-09 NOTE — Telephone Encounter (Signed)
Patient is requesting a prescription for cough due to testing positive yesterday.[facility] # [phone]

## 2019-11-09 NOTE — Telephone Encounter (Signed)
Tessalon 100 mg, 1 taken 3 times daily as needed for cough, #15, 2 refills

## 2019-11-09 NOTE — Telephone Encounter (Signed)
So cough is a normal part of this illness It is unlikely that a cough medication will take the edge off of it May use Delsym or Robitussin-DM Medicaid does not pay for prescription cough medicines Her prescription options would be Tessalon Or Hycodan which is hydrocodone cough medication which is only meant for evening use  Please see what patient would like to do that we go from there thank you

## 2019-11-09 NOTE — Telephone Encounter (Signed)
Patient states she tested positive yesterday for Covid and would like a prescription for some cough meds if possible.

## 2021-06-27 ENCOUNTER — Other Ambulatory Visit: Payer: Self-pay

## 2021-06-27 ENCOUNTER — Telehealth (INDEPENDENT_AMBULATORY_CARE_PROVIDER_SITE_OTHER): Payer: Medicaid Other | Admitting: Family Medicine

## 2021-06-27 ENCOUNTER — Telehealth: Payer: Self-pay | Admitting: Family Medicine

## 2021-06-27 DIAGNOSIS — U071 COVID-19: Secondary | ICD-10-CM

## 2021-06-27 NOTE — Telephone Encounter (Signed)
Patient scheduled phoone visit with Dr Ladona Ridgel today at 3:10pm

## 2021-06-27 NOTE — Progress Notes (Signed)
   Patient ID: Rachael Espinoza, female    DOB: 1996/04/17, 25 y.o.   MRN: [id number]   Virtual Visit via Telephone Note  I connected with Rachael Espinoza on 06/27/21 at  3:10 PM EDT by telephone and verified that I am speaking with the correct person using two identifiers.  Location: Patient: home Provider: office   I discussed the limitations, risks, security and privacy concerns of performing an evaluation and management service by telephone and the availability of in person appointments. I also discussed with the patient that there may be a patient responsible charge related to this service. The patient expressed understanding and agreed to proceed.  Chief Complaint  Patient presents with   Covid Positive    Patient started with symptoms a few days ago and tested positive today. Symptoms include H/A, body aches, sore throat   Subjective:    HPI Pt tested positive for covid. Feeling bad about 2-3 days.  Yesterday was worse.  Today is better.  Hard to swallow.  Having headache, body aches, sore throat.  No fever.  Working from home. Went to school orientation and got this.  Meds- ibuprofen, 400mg  ibuprofen. Cough drops.  Having back pain and knee pain. Sitting in bath to help. Using heating pad.  Medical History Rachael Espinoza has a past medical history of Chiari malformation, Contraceptive management (08/25/2014), Headache(784.0), Intracranial aneurysm, Pinched nerve, Supervision of normal pregnancy (08/13/2016), and Vasovagal syncope.   Outpatient Encounter Medications as of 06/27/2021  Medication Sig   PARAGARD INTRAUTERINE COPPER IU by Intrauterine route.   [DISCONTINUED] benzonatate (TESSALON) 100 MG capsule Take 1 capsule (100 mg total) by mouth 3 (three) times daily as needed for cough.   No facility-administered encounter medications on file as of 06/27/2021.     Review of Systems  Constitutional:  Negative for chills and fever.  HENT:  Positive for sore throat.  Negative for congestion, ear pain, rhinorrhea, sinus pressure and sinus pain.   Eyes:  Negative for pain, discharge and itching.  Respiratory:  Negative for cough.   Gastrointestinal:  Negative for constipation, diarrhea, nausea and vomiting.  Musculoskeletal:  Positive for back pain and myalgias.  Neurological:  Positive for headaches.    Vitals There were no vitals taken for this visit.  Objective:   Physical Exam No PE due to phone visit.   Assessment and Plan   1. COVID-19   Covid- stable. Symptoms mild at this point. Take vitamin c, zinc and vit d,  600mg  ibuprofen every 6 hrs for fever, body aches. Declining magic mouthwash. Use cough syrup, lozenges, tylenol prn. Will give work note out 8/17-20. Pt to call if not improving.   Return if symptoms worsen or fail to improve.    Follow Up Instructions:    I discussed the assessment and treatment plan with the patient. The patient was provided an opportunity to ask questions and all were answered. The patient agreed with the plan and demonstrated an understanding of the instructions.   The patient was advised to call back or seek an in-person evaluation if the symptoms worsen or if the condition fails to improve as anticipated.  I provided 15 minutes of non-face-to-face time during this encounter.

## 2021-06-27 NOTE — Telephone Encounter (Signed)
Mom called states that patient was just dx with Covid and is requesting medication.  CB# [phone]

## 2022-05-07 ENCOUNTER — Other Ambulatory Visit (HOSPITAL_COMMUNITY)
Admission: RE | Admit: 2022-05-07 | Discharge: 2022-05-07 | Disposition: A | Discharge status: Home / Self Care | Payer: 59 | Source: Ambulatory Visit | Attending: Adult Health | Admitting: Adult Health

## 2022-05-07 ENCOUNTER — Encounter: Payer: Self-pay | Admitting: Adult Health

## 2022-05-07 ENCOUNTER — Ambulatory Visit (INDEPENDENT_AMBULATORY_CARE_PROVIDER_SITE_OTHER): Payer: 59 | Admitting: Adult Health

## 2022-05-07 VITALS — BP 110/65 | HR 80 | Ht 64.0 in | Wt 128.0 lb

## 2022-05-07 DIAGNOSIS — Z124 Encounter for screening for malignant neoplasm of cervix: Secondary | ICD-10-CM | POA: Insufficient documentation

## 2022-05-07 DIAGNOSIS — Z30017 Encounter for initial prescription of implantable subdermal contraceptive: Secondary | ICD-10-CM | POA: Insufficient documentation

## 2022-05-07 DIAGNOSIS — R55 Syncope and collapse: Secondary | ICD-10-CM | POA: Diagnosis not present

## 2022-05-07 DIAGNOSIS — Z3009 Encounter for other general counseling and advice on contraception: Secondary | ICD-10-CM | POA: Diagnosis not present

## 2022-05-07 MED ORDER — ETONOGESTREL 68 MG ~~LOC~~ IMPL
68.0000 mg | DRUG_IMPLANT | Freq: Once | SUBCUTANEOUS | Status: AC
  Administered 2022-05-07: 68 mg via SUBCUTANEOUS

## 2022-05-07 NOTE — Progress Notes (Signed)
  Subjective:     Patient ID: Rachael Espinoza, female   DOB: June 16, 1996, 26 y.o.   MRN: [id number]  HPI Rachael Espinoza is a 26 year old white female, single, G2P 2 in to discuss birth control and she wants nexplanon. Has had in past. Last sex over 2 weeks ago. She is in need of pap, last one was 2017. She drove up from [place] today. PCP is Lilyan Punt MD.  Review of Systems Periods regular Reviewed past medical,surgical, social and family history. Reviewed medications and allergies.     Objective:   Physical Exam BP 117/80 (BP Location: Left Arm, Patient Position: Sitting, Cuff Size: Normal)   Pulse 87   Ht 5\' 4"  (1.626 m)   Wt 128 lb (58.1 kg)   LMP 04/14/2022 (Approximate)   BMI 21.97 kg/m  UPT is negative Skin warm and dry.Pelvic: external genitalia is normal in appearance no lesions, vagina: pink,urethra has no lesions or masses noted, cervix: bulbous, pap with GC/CHL and HR HPV genotyping performed,uterus: normal size, shape and contour, non tender, no masses felt, adnexa: no masses or tenderness noted. Bladder is non tender and no masses felt.    Consent signed, time out called. Left arm cleansed with betadine, and injected with 1.5 cc 2% lidocaine and waited til numb. Nexplanon easily inserted and steri strips applied.Rod easily palpated by provider and pt. Pressure dressing applied. Pt has vaso vaginal episode after insertion was still on exam table, lasted less than a minute, legs raised then she had cheese crackers and soda, BP recheck 110/65 and she waited about 15 minutes before leaving. She said this happed when IUD removed and with last nexplanon. She had not eaten this morning either. Fall risk is low Examination chaperoned by Malachy Mood LPN     Assessment:     1. Routine cervical smear Pap sent Pap in 3 years if normal  2. General counseling and advice for contraceptive management She wants nexplanon has had before.   3. Nexplanon insertion Lot [id number] Exp  [id number] Use condoms x 2 weeks, keep clean and dry x 24 hours, no heavy lifting, keep steri strips on x 72 hours, Keep pressure dressing on x 24 hours. Follow up prn problems.   4. Vasovagal syncope She has not seen neurologist lately Follow up with me     Plan:      Pap in 3 years Remove nexplanon in 3 years of sooner if desired.

## 2022-05-09 LAB — CYTOLOGY - PAP
Chlamydia: NEGATIVE
Comment: NEGATIVE
Comment: NEGATIVE
Comment: NORMAL
Diagnosis: UNDETERMINED — AB
High risk HPV: NEGATIVE
Neisseria Gonorrhea: NEGATIVE

## 2022-05-10 ENCOUNTER — Encounter: Payer: Self-pay | Admitting: Adult Health

## 2022-05-10 DIAGNOSIS — R87610 Atypical squamous cells of undetermined significance on cytologic smear of cervix (ASC-US): Secondary | ICD-10-CM | POA: Insufficient documentation

## 2022-05-10 HISTORY — DX: Atypical squamous cells of undetermined significance on cytologic smear of cervix (ASC-US): R87.610

## 2022-06-17 ENCOUNTER — Telehealth: Payer: 59 | Admitting: Physician Assistant

## 2022-06-17 DIAGNOSIS — B3731 Acute candidiasis of vulva and vagina: Secondary | ICD-10-CM | POA: Diagnosis not present

## 2022-06-17 MED ORDER — FLUCONAZOLE 150 MG PO TABS: 150.0000 mg | ORAL_TABLET | ORAL | 0 refills | Status: DC | PRN

## 2022-06-17 NOTE — Progress Notes (Signed)

## 2022-08-22 ENCOUNTER — Emergency Department (HOSPITAL_COMMUNITY)
Admission: EM | Admit: 2022-08-22 | Discharge: 2022-08-22 | Disposition: A | Discharge status: Home / Self Care | Payer: No Typology Code available for payment source | Attending: Student | Admitting: Student

## 2022-08-22 ENCOUNTER — Other Ambulatory Visit: Payer: Self-pay

## 2022-08-22 ENCOUNTER — Emergency Department (HOSPITAL_COMMUNITY): Payer: No Typology Code available for payment source

## 2022-08-22 DIAGNOSIS — Y92410 Unspecified street and highway as the place of occurrence of the external cause: Secondary | ICD-10-CM | POA: Diagnosis not present

## 2022-08-22 DIAGNOSIS — M549 Dorsalgia, unspecified: Secondary | ICD-10-CM | POA: Insufficient documentation

## 2022-08-22 DIAGNOSIS — S199XXA Unspecified injury of neck, initial encounter: Secondary | ICD-10-CM | POA: Insufficient documentation

## 2022-08-22 DIAGNOSIS — R110 Nausea: Secondary | ICD-10-CM | POA: Insufficient documentation

## 2022-08-22 DIAGNOSIS — R519 Headache, unspecified: Secondary | ICD-10-CM | POA: Insufficient documentation

## 2022-08-22 MED ORDER — ACETAMINOPHEN 325 MG PO TABS
650.0000 mg | ORAL_TABLET | Freq: Once | ORAL | Status: AC
  Administered 2022-08-22: 650 mg via ORAL
  Filled 2022-08-22: qty 2
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
## 2022-08-22 NOTE — ED Notes (Signed)
ED Provider at bedside. Pt brought into triage bay 1 for evaluation. 

## 2022-09-03 ENCOUNTER — Ambulatory Visit: Payer: 59 | Admitting: Nurse Practitioner

## 2023-07-10 ENCOUNTER — Telehealth: Payer: Self-pay | Admitting: Family Medicine

## 2023-07-10 NOTE — Telephone Encounter (Signed)
Patient has physical on 10/13 and requesting complete blood work up done

## 2023-07-11 ENCOUNTER — Encounter: Payer: Self-pay | Admitting: Nurse Practitioner

## 2023-07-11 ENCOUNTER — Other Ambulatory Visit: Payer: Self-pay | Admitting: Nurse Practitioner

## 2023-07-11 DIAGNOSIS — Z0000 Encounter for general adult medical examination without abnormal findings: Secondary | ICD-10-CM

## 2023-07-11 DIAGNOSIS — Z130 Encounter for screening for diseases of the blood and blood-forming organs and certain disorders involving the immune mechanism: Secondary | ICD-10-CM

## 2023-07-11 DIAGNOSIS — Z13228 Encounter for screening for other metabolic disorders: Secondary | ICD-10-CM

## 2023-07-11 DIAGNOSIS — Z13220 Encounter for screening for lipoid disorders: Secondary | ICD-10-CM

## 2023-07-11 DIAGNOSIS — Z1329 Encounter for screening for other suspected endocrine disorder: Secondary | ICD-10-CM

## 2023-08-16 LAB — COMPREHENSIVE METABOLIC PANEL
ALT: 20 [IU]/L (ref 0–32)
AST: 18 [IU]/L (ref 0–40)
Albumin: 4.6 g/dL (ref 4.0–5.0)
Alkaline Phosphatase: 70 [IU]/L (ref 44–121)
BUN/Creatinine Ratio: 11 (ref 9–23)
BUN: 11 mg/dL (ref 6–20)
Bilirubin Total: 0.4 mg/dL (ref 0.0–1.2)
CO2: 21 mmol/L (ref 20–29)
Calcium: 9.6 mg/dL (ref 8.7–10.2)
Chloride: 102 mmol/L (ref 96–106)
Creatinine, Ser: 1 mg/dL (ref 0.57–1.00)
Globulin, Total: 3 g/dL (ref 1.5–4.5)
Glucose: 86 mg/dL (ref 70–99)
Potassium: 4.7 mmol/L (ref 3.5–5.2)
Sodium: 139 mmol/L (ref 134–144)
Total Protein: 7.6 g/dL (ref 6.0–8.5)
eGFR: 79 mL/min/{1.73_m2} (ref >59)

## 2023-08-16 LAB — CBC WITH DIFFERENTIAL/PLATELET
Basophils Absolute: 0 10*3/uL (ref 0.0–0.2)
Basos: 1 %
EOS (ABSOLUTE): 0.1 10*3/uL (ref 0.0–0.4)
Eos: 2 %
Hematocrit: 42 % (ref 34.0–46.6)
Hemoglobin: 14.1 g/dL (ref 11.1–15.9)
Immature Grans (Abs): 0 10*3/uL (ref 0.0–0.1)
Immature Granulocytes: 0 %
Lymphocytes Absolute: 1.8 10*3/uL (ref 0.7–3.1)
Lymphs: 28 %
MCH: 32 pg (ref 26.6–33.0)
MCHC: 33.6 g/dL (ref 31.5–35.7)
MCV: 96 fL (ref 79–97)
Monocytes Absolute: 0.5 10*3/uL (ref 0.1–0.9)
Monocytes: 7 %
Neutrophils Absolute: 4.1 10*3/uL (ref 1.4–7.0)
Neutrophils: 62 %
Platelets: 282 10*3/uL (ref 150–450)
RBC: 4.4 x10E6/uL (ref 3.77–5.28)
RDW: 11.6 % — ABNORMAL LOW (ref 11.7–15.4)
WBC: 6.6 10*3/uL (ref 3.4–10.8)

## 2023-08-16 LAB — LIPID PANEL
Chol/HDL Ratio: 3.6 {ratio} (ref 0.0–4.4)
Cholesterol, Total: 165 mg/dL (ref 100–199)
HDL: 46 mg/dL (ref >39)
LDL Chol Calc (NIH): 104 mg/dL — ABNORMAL HIGH (ref 0–99)
Triglycerides: 79 mg/dL (ref 0–149)
VLDL Cholesterol Cal: 15 mg/dL (ref 5–40)

## 2023-08-16 LAB — TSH: TSH: 1.35 u[IU]/mL (ref 0.450–4.500)

## 2023-08-21 ENCOUNTER — Encounter: Payer: No Typology Code available for payment source | Admitting: Obstetrics & Gynecology

## 2023-08-28 ENCOUNTER — Ambulatory Visit: Payer: No Typology Code available for payment source | Admitting: Nurse Practitioner

## 2023-08-28 VITALS — BP 113/77 | HR 98 | Temp 97.7°F | Ht 64.0 in | Wt 141.8 lb

## 2023-08-28 DIAGNOSIS — Z01419 Encounter for gynecological examination (general) (routine) without abnormal findings: Secondary | ICD-10-CM

## 2023-08-28 NOTE — Progress Notes (Signed)
Subjective:    Patient ID: Rachael Espinoza, female    DOB: 1996/05/14, 27 y.o.   MRN: [id number]  HPI The patient comes in today for a wellness visit.    A review of their health history was completed.  A review of medications was also completed.  Any needed refills; No  Eating habits: Good; doing more meal prepping  Regular exercise: Gym 2-4 days a week  Specialist pt sees on regular basis: Neurology   Preventative health issues were discussed.   Additional concerns: Runny nose fro the past couple of days possibly from weather change  Vapes: contains nicotine; no CBD Has tried multiple contraceptives including Nexplanon and Paraguard with side effects; had Nexplanon removed; had her first menses last week since removal; same female sexual partner; uses condoms consistently; defers STD or other contraceptives at this time Regular vision and dental exams   Review of Systems  Constitutional:  Negative for activity change, appetite change, fatigue and fever.  HENT:  Positive for congestion, postnasal drip and rhinorrhea. Negative for ear pain, sinus pressure, sore throat and trouble swallowing.   Respiratory:  Negative for cough, chest tightness, shortness of breath and wheezing.   Cardiovascular:  Negative for chest pain.  Gastrointestinal:  Negative for abdominal distention, abdominal pain, constipation, diarrhea, nausea and vomiting.  Genitourinary:  Negative for difficulty urinating, dysuria, enuresis, frequency, genital sores, menstrual problem, pelvic pain, urgency and vaginal discharge.      08/28/2023    9:47 AM  Depression screen PHQ 2/9  Decreased Interest 1  Down, Depressed, Hopeless 1  PHQ - 2 Score 2  Altered sleeping 2  Tired, decreased energy 1  Feeling bad or failure about yourself  1  Trouble concentrating 1  Moving slowly or fidgety/restless 0  Suicidal thoughts 0  PHQ-9 Score 7  Difficult doing work/chores Somewhat difficult      08/28/2023     9:48 AM  GAD 7 : Generalized Anxiety Score  Nervous, Anxious, on Edge 1  Control/stop worrying 1  Worry too much - different things 1  Trouble relaxing 1  Restless 0  Easily annoyed or irritable 1  Afraid - awful might happen 0  Total GAD 7 Score 5  Anxiety Difficulty Somewhat difficult         Objective:   Physical Exam Vitals and nursing note reviewed. Exam conducted with a chaperone present.  Constitutional:      General: She is not in acute distress.    Appearance: She is well-developed.  HENT:     Ears:     Comments: TMs mild clear effusion; no erythema    Mouth/Throat:     Mouth: Mucous membranes are moist.     Pharynx: Oropharynx is clear.  Neck:     Thyroid: No thyromegaly.     Trachea: No tracheal deviation.     Comments: Thyroid non tender to palpation. No mass or goiter noted.  Cardiovascular:     Rate and Rhythm: Normal rate and regular rhythm.     Heart sounds: Normal heart sounds. No murmur heard. Pulmonary:     Effort: Pulmonary effort is normal.     Breath sounds: Normal breath sounds.  Chest:  Breasts:    Right: No swelling, inverted nipple, mass, skin change or tenderness.     Left: No swelling, inverted nipple, mass, skin change or tenderness.  Abdominal:     General: There is no distension.     Palpations: Abdomen is soft.  Tenderness: There is no abdominal tenderness.  Genitourinary:    Comments: Defers GU exam; last PAP smear 05/07/22 Musculoskeletal:     Cervical back: Normal range of motion and neck supple.  Lymphadenopathy:     Cervical: No cervical adenopathy.     Upper Body:     Right upper body: No supraclavicular, axillary or pectoral adenopathy.     Left upper body: No supraclavicular, axillary or pectoral adenopathy.  Skin:    General: Skin is warm and dry.     Findings: No rash.  Neurological:     Mental Status: She is alert and oriented to person, place, and time.  Psychiatric:        Mood and Affect: Mood normal.         Behavior: Behavior normal.        Thought Content: Thought content normal.        Judgment: Judgment normal.    Today's Vitals   08/28/23 0940  BP: 113/77  Pulse: 98  Temp: 97.7 F (36.5 C)  SpO2: 99%  Weight: 141 lb 12.8 oz (64.3 kg)  Height: 5\' 4"  (1.626 m)   Body mass index is 24.34 kg/m.    Results for orders placed or performed in visit on 07/11/23  CBC with Differential/Platelet  Result Value Ref Range   WBC 6.6 3.4 - 10.8 x10E3/uL   RBC 4.40 3.77 - 5.28 x10E6/uL   Hemoglobin 14.1 11.1 - 15.9 g/dL   Hematocrit 36.6 44.0 - 46.6 %   MCV 96 79 - 97 fL   MCH 32.0 26.6 - 33.0 pg   MCHC 33.6 31.5 - 35.7 g/dL   RDW 34.7 (L) 42.5 - 95.6 %   Platelets 282 150 - 450 x10E3/uL   Neutrophils 62 Not Estab. %   Lymphs 28 Not Estab. %   Monocytes 7 Not Estab. %   Eos 2 Not Estab. %   Basos 1 Not Estab. %   Neutrophils Absolute 4.1 1.4 - 7.0 x10E3/uL   Lymphocytes Absolute 1.8 0.7 - 3.1 x10E3/uL   Monocytes Absolute 0.5 0.1 - 0.9 x10E3/uL   EOS (ABSOLUTE) 0.1 0.0 - 0.4 x10E3/uL   Basophils Absolute 0.0 0.0 - 0.2 x10E3/uL   Immature Granulocytes 0 Not Estab. %   Immature Grans (Abs) 0.0 0.0 - 0.1 x10E3/uL  Comprehensive metabolic panel  Result Value Ref Range   Glucose 86 70 - 99 mg/dL   BUN 11 6 - 20 mg/dL   Creatinine, Ser 3.87 0.57 - 1.00 mg/dL   eGFR 79 >56 EP/PIR/5.18   BUN/Creatinine Ratio 11 9 - 23   Sodium 139 134 - 144 mmol/L   Potassium 4.7 3.5 - 5.2 mmol/L   Chloride 102 96 - 106 mmol/L   CO2 21 20 - 29 mmol/L   Calcium 9.6 8.7 - 10.2 mg/dL   Total Protein 7.6 6.0 - 8.5 g/dL   Albumin 4.6 4.0 - 5.0 g/dL   Globulin, Total 3.0 1.5 - 4.5 g/dL   Bilirubin Total 0.4 0.0 - 1.2 mg/dL   Alkaline Phosphatase 70 44 - 121 IU/L   AST 18 0 - 40 IU/L   ALT 20 0 - 32 IU/L  Lipid panel  Result Value Ref Range   Cholesterol, Total 165 100 - 199 mg/dL   Triglycerides 79 0 - 149 mg/dL   HDL 46 >84 mg/dL   VLDL Cholesterol Cal 15 5 - 40 mg/dL   LDL Chol Calc (NIH) 166  (H) 0 - 99 mg/dL  Chol/HDL Ratio 3.6 0.0 - 4.4 ratio  TSH  Result Value Ref Range   TSH 1.350 0.450 - 4.500 uIU/mL       Assessment & Plan:  Well woman exam  Reviewed labs with patient during visit. Defers need for medication related to anxiety or depression. Defers STD testing and other contraception at this time. Encouraged continued use of condoms. Continue efforts at healthy diet and regular exercise. Return in about 1 year (around 08/27/2024) for physical.

## 2023-09-01 ENCOUNTER — Encounter: Payer: Self-pay | Admitting: Nurse Practitioner

## 2023-10-28 ENCOUNTER — Other Ambulatory Visit: Payer: Self-pay | Admitting: Medical Genetics

## 2023-12-12 ENCOUNTER — Other Ambulatory Visit (HOSPITAL_COMMUNITY)
Admission: RE | Admit: 2023-12-12 | Discharge: 2023-12-12 | Disposition: A | Discharge status: Home / Self Care | Payer: Self-pay | Source: Ambulatory Visit | Attending: Oncology | Admitting: Oncology

## 2023-12-22 ENCOUNTER — Telehealth: Payer: Self-pay

## 2023-12-22 NOTE — Telephone Encounter (Signed)
 Please advise.      Patient calling would like to know if another appointment is needed to get on birth control. States Orelia Binet advised would send progesterone birth control rx upon request.    [phone]

## 2023-12-23 ENCOUNTER — Other Ambulatory Visit: Payer: Self-pay | Admitting: Nurse Practitioner

## 2023-12-23 MED ORDER — NORETHINDRONE 0.35 MG PO TABS: 1.0000 | ORAL_TABLET | Freq: Every day | ORAL | 3 refills | Status: DC

## 2023-12-25 LAB — GENECONNECT MOLECULAR SCREEN: Genetic Analysis Overall Interpretation: NEGATIVE

## 2024-01-22 ENCOUNTER — Encounter: Payer: Self-pay | Admitting: Obstetrics & Gynecology

## 2024-01-22 ENCOUNTER — Ambulatory Visit (INDEPENDENT_AMBULATORY_CARE_PROVIDER_SITE_OTHER): Admitting: Obstetrics & Gynecology

## 2024-01-22 VITALS — Ht 63.0 in | Wt 131.0 lb

## 2024-01-22 DIAGNOSIS — Z302 Encounter for sterilization: Secondary | ICD-10-CM

## 2024-01-22 DIAGNOSIS — Z3009 Encounter for other general counseling and advice on contraception: Secondary | ICD-10-CM

## 2024-01-22 NOTE — Progress Notes (Signed)
 Follow up appointment  Discuss tubal ligation  Chief Complaint  Patient presents with   discuss getting a tubal    Height 5\' 3"  (1.6 m), weight 131 lb (59.4 kg), last menstrual period 01/03/2024.  Has used IUD, nexplanon, depo does not want to use any of them Declines COC Currently abstinent  Wants definitive surgery, bilateral salpingectomy Has Chiari malformation  MEDS ordered this encounter: No orders of the defined types were placed in this encounter.   Orders for this encounter: No orders of the defined types were placed in this encounter.   Impression + Management Plan   ICD-10-CM   1. Encounter for consultation for female sterilization  Z30.09    RA Lap bilateral salpingectomy 03/31/24      Follow Up: Return in about 3 months (around 04/09/2024) for Post Op, with Dr Despina Hidden.     All questions were answered.  Past Medical History:  Diagnosis Date   ASCUS of cervix with negative high risk HPV 05/10/2022   05/10/22 repeat pap in 3 years per ASCCP guidelines, 5 year risk for CIN 3+ is 0.40%   Chiari malformation    Contraceptive management 08/25/2014   Headache(784.0)    Intracranial aneurysm    Pinched nerve    Supervision of normal pregnancy 08/13/2016    [facility] at   10 weeks FOB  Ileene Primus 20 yo HM 2nd child Dating By   LMP and [place] Pap  08/13/16  GC/CT Initial:                36+wks: Genetic Screen NT/IT:  CF screen  Anatomic [place]  Flu vaccine  Tdap Recommended ~ 28wks Glucose Screen  2 hr GBS  Feed Preference  Contraception  Circumcision  Childbirth Classes  Pediatrician     Vasovagal syncope     Past Surgical History:  Procedure Laterality Date   NO PAST SURGERIES      OB History     Gravida  2   Para  2   Term  2   Preterm      AB      Living  2      SAB      IAB      Ectopic      Multiple  0   Live Births  2           No Known Allergies  Social History   Socioeconomic History   Marital status:  Single    Spouse name: Not on file   Number of children: Not on file   Years of education: Not on file   Highest education level: Not on file  Occupational History   Not on file  Tobacco Use   Smoking status: Former    Current packs/day: 0.00    Types: Cigarettes   Smokeless tobacco: Never  Vaping Use   Vaping status: Some Days  Substance and Sexual Activity   Alcohol use: Yes    Comment: occ   Drug use: No    Comment: "pot in past"   Sexual activity: Not Currently    Birth control/protection: Condom  Other Topics Concern   Not on file  Social History Narrative   Not on file   Social Drivers of Health   Financial Resource Strain: Not on file  Food Insecurity: Not on file  Transportation Needs: Not on file  Physical Activity: Not on file  Stress: Not on file  Social Connections: Not on  file    Family History  Problem Relation Age of Onset   Other Maternal Grandmother        cushing's   Cancer Maternal Grandfather        skin   Cancer Father        lung; kidney   Hypertension Mother    Thyroid disease Mother    Cancer Maternal Aunt        cervical cancer   Cancer Other        cervical; maternal great grandma   Heart disease Other    Cancer Other        renal cell

## 2024-01-29 ENCOUNTER — Encounter: Payer: Self-pay | Admitting: Obstetrics & Gynecology

## 2024-03-25 ENCOUNTER — Other Ambulatory Visit: Payer: Self-pay | Admitting: Obstetrics & Gynecology

## 2024-03-25 DIAGNOSIS — Z01818 Encounter for other preprocedural examination: Secondary | ICD-10-CM

## 2024-03-26 NOTE — Patient Instructions (Signed)
 Rachael Espinoza  03/26/2024     @PREFPERIOPPHARMACY @   Your procedure is scheduled on 03/31/2024.   Report to [facility] at 9:00 A.M.   Call this number if you have problems the morning of surgery:   [phone]  If you experience any cold or flu symptoms such as cough, fever, chills, shortness of breath, etc. between now and your scheduled surgery, please notify us  at the above number.   Remember:   Do not eat after midnight.   You may drink clear liquids until 7:00 AM .  Clear liquids allowed are:                    Water, Carbonated beverages (diabetics please choose diet or no sugar options), and Clear Sports drink (No red color; diabetics please choose diet or no sugar options)    Take these medicines the morning of surgery with A SIP OF WATER : none    Do not wear jewelry, make-up or nail polish, including gel polish,  artificial nails, or any other type of covering on natural nails (fingers and  toes).  Do not wear lotions, powders, or perfumes, or deodorant.  Do not shave 48 hours prior to surgery.  Men may shave face and neck.  Do not bring valuables to the hospital.  [facility] is not responsible for any belongings or valuables.  Contacts, dentures or bridgework may not be worn into surgery.  Leave your suitcase in the car.  After surgery it may be brought to your room.  For patients admitted to the hospital, discharge time will be determined by your treatment team.  Patients discharged the day of surgery will not be allowed to drive home.   Name and phone number of your driver:   family  Special instructions:  N/A  Please read over the following fact sheets that you were given. Care and Recovery After Surgery  Surgery to Take Out One or Both Fallopian Tubes (Salpingectomy): What to Expect  Salpingectomy is surgery to remove one or both of the fallopian tubes. The fallopian tubes connect the ovaries to the uterus. You may need this surgery if: A  procedure is being done on your belly or on the area between the hips (pelvis), and the health care provider thinks that removing the tubes will lower your risk for cancer. You have an ectopic pregnancy. This is when a fertilized egg attaches to the fallopian tube instead of the uterus. An ectopic pregnancy can cause the tube to burst or tear. You don't want to have children (sterilization). In this case, both tubes will be removed. You have cancer of the fallopian tube or nearby organs. You're at high risk for cancer of the ovaries. Your tube and ovary have become twisted. There are two methods that can be used to remove the fallopian tubes: An open method. One large cut is made in your belly. A laparoscopic method. Several small cuts are made in your belly. A thin, lighted camera (laparoscope) and other instruments are used to remove the fallopian tubes. The provider may also use a robot to help in surgery. Tell a health care provider about: Any allergies you have. All medicines you take. These include vitamins, herbs, eye drops, and creams. Any problems you or family members have had with anesthesia. Any bleeding problems you have. Any surgeries you've had. Any medical conditions you have. Whether you're pregnant, may be pregnant, or wish to get pregnant. What are the risks?  Your provider will talk with you about risks. These may include: Infection. Bleeding. Allergic reactions to medicines. Blood clots in the legs or lungs. Damage to nearby structures or organs. What happens before the surgery? When to stop eating and drinking Eat and drink only as you've been told. You may be told this: 8 hours before your surgery Stop eating most foods. Do not eat meat, fried foods, or fatty foods. Eat only light foods, such as toast or crackers. All liquids are OK except energy drinks and alcohol. 6 hours before your surgery Stop eating. Drink only clear liquids, such as water, clear fruit  juice, black coffee, plain tea, and sports drinks. Do not drink energy drinks or alcohol. 2 hours before your surgery Stop drinking all liquids. You may be allowed to take medicines with small sips of water. If you do not eat and drink as told, your surgery may be delayed or canceled. Medicines Ask about changing or stopping: Any medicines you take. Any vitamins, herbs, or supplements you take. Do not take aspirin or ibuprofen  unless you're told to. Surgery safety For your safety, you may: Need to wash your skin with a soap that kills germs. Get antibiotics. Have your surgery site marked. Have hair removed at the surgery site. General instructions Ask if you'll be staying overnight in the hospital. If you'll be going home right after the surgery, plan to have a responsible adult: Drive you home from the hospital or clinic. You won't be allowed to drive. Stay with you for the time you're told. What happens during the surgery? An IV will be put into a vein in your hand or arm. You may be given: A sedative to help you relax. Anesthesia to keep you from feeling pain. A small, thin tube (catheter) may be inserted through your urethra and into your bladder. This will drain pee during your surgery. Depending on the type of surgery you're having, one cut or several small cuts will be made in your belly. Your fallopian tube (or tubes) will be cut from the ovary and uterus and removed from your body. The cut or cuts in your belly will be closed with stitches, staples, skin glue, or tape strips. A bandage may be placed over your cut or cuts. These steps may vary. Ask what you can expect. What happens after the surgery?  You'll be watched closely until you leave. This includes checking your pain level, blood pressure, heart rate, and breathing rate. You may have to wear compression stockings to reduce swelling and help prevent blood clots in your legs. You'll be given pain medicine as  needed. This information is not intended to replace advice given to you by your health care provider. Make sure you discuss any questions you have with your health care provider. Document Revised: 06/09/2023 Document Reviewed: 06/09/2023 Elsevier Patient Education  2024 Elsevier Inc.  General Anesthesia, Adult General anesthesia is the use of medicine to make you fall asleep (unconscious) for a medical procedure. General anesthesia must be used for certain procedures. It is often recommended for surgery or procedures that: Last a long time. Require you to be still or in an unusual position. Are major and can cause blood loss. Affect your breathing. The medicines used for general anesthesia are called general anesthetics. During general anesthesia, these medicines are given along with medicines that: Prevent pain. Control your blood pressure. Relax your muscles. Prevent nausea and vomiting after the procedure. Tell a health care provider about: Any allergies you  have. All medicines you are taking, including vitamins, herbs, eye drops, creams, and over-the-counter medicines. Your history of any: Medical conditions you have, including: High blood pressure. Bleeding problems. Diabetes. Heart or lung conditions, such as: Heart failure. Sleep apnea. Asthma. Chronic obstructive pulmonary disease (COPD). Current or recent illnesses, such as: Upper respiratory, chest, or ear infections. Cough or fever. Tobacco or drug use, including marijuana or alcohol use. Depression or anxiety. Surgeries and types of anesthetics you have had. Problems you or family members have had with anesthetic medicines. Whether you are pregnant or may be pregnant. Whether you have any chipped or loose teeth, dentures, caps, bridgework, or issues with your mouth, swallowing, or choking. What are the risks? Your health care provider will talk with you about risks. These may include: Allergic reaction to the  medicines. Lung and heart problems. Inhaling food or liquid from the stomach into the lungs (aspiration). Nerve injury. Injury to the lips, mouth, teeth, or gums. Stroke. Waking up during your procedure and being unable to move. This is rare. These problems are more likely to develop if you are having a major surgery or if you have an advanced or serious medical condition. You can prevent some of these complications by answering all of your health care provider's questions thoroughly and by following all instructions before your procedure. General anesthesia can cause side effects, including: Nausea or vomiting. A sore throat or hoarseness from the breathing tube. Wheezing or coughing. Shaking chills or feeling cold. Body aches. Sleepiness. Confusion, agitation (delirium), or anxiety. What happens before the procedure? When to stop eating and drinking Follow instructions from your health care provider about what you may eat and drink before your procedure. If you do not follow your health care provider's instructions, your procedure may be delayed or canceled. Medicines Ask your health care provider about: Changing or stopping your regular medicines. These include any diabetes medicines or blood thinners you take. Taking medicines such as aspirin and ibuprofen . These medicines can thin your blood. Do not take them unless your health care provider tells you to. Taking over-the-counter medicines, vitamins, herbs, and supplements. General instructions Do not use any products that contain nicotine or tobacco for at least 4 weeks before the procedure. These products include cigarettes, chewing tobacco, and vaping devices, such as e-cigarettes. If you need help quitting, ask your health care provider. If you brush your teeth on the morning of the procedure, make sure to spit out all of the water and toothpaste. If told by your health care provider, bring your sleep apnea device with you to  surgery (if applicable). If you will be going home right after the procedure, plan to have a responsible adult: Take you home from the hospital or clinic. You will not be allowed to drive. Care for you for the time you are told. What happens during the procedure?  An IV will be inserted into one of your veins. You will be given one or more of the following through a face mask or IV: A sedative. This helps you relax. Anesthesia. This will: Numb certain areas of your body. Make you fall asleep for surgery. After you are unconscious, a breathing tube may be inserted down your throat to help you breathe. This will be removed before you wake up. An anesthesia provider, such as an anesthesiologist, will stay with you throughout your procedure. The anesthesia provider will: Keep you comfortable and safe by continuing to give you medicines and adjusting the amount of medicine  that you get. Monitor your blood pressure, heart rate, and oxygen levels to make sure that the anesthetics do not cause any problems. The procedure may vary among health care providers and hospitals. What happens after the procedure? Your blood pressure, temperature, heart rate, breathing rate, and blood oxygen level will be monitored until you leave the hospital or clinic. You will wake up in a recovery area. You may wake up slowly. You may be given medicine to help you with pain, nausea, or any other side effects from the anesthesia. Summary General anesthesia is the use of medicine to make you fall asleep (unconscious) for a medical procedure. Follow your health care provider's instructions about when to stop eating, drinking, or taking certain medicines before your procedure. Plan to have a responsible adult take you home from the hospital or clinic. This information is not intended to replace advice given to you by your health care provider. Make sure you discuss any questions you have with your health care  provider. Document Revised: 01/24/2022 Document Reviewed: 01/24/2022 Elsevier Patient Education  2024 Elsevier Inc.   How to Use Chlorhexidine Prepackaged Cloths at Home Chlorhexidine gluconate (CHG) is a germ-killing (antiseptic) wash that is used to clean the skin. It can get rid of the germs that normally live on the skin and can keep them away for about 24 hours. One way to clean your skin with CHG is by using prepackaged CHG cloths. The following information gives steps on how to use CHG cloths to clean your skin. Supplies needed: CHG cloths. How to clean your whole body with CHG cloths Follow the order of these steps unless your health care provider gives you other instructions: Use a new CHG cloth for each part of your body and after each step. Wash between any folds of skin, under your breasts, and between your fingers and toes. Front of neck, shoulders, and chest. Do not get CHG in your eyes, mouth, and ears. If CHG gets into your eyes or ears, rinse them well with water. Both arms, armpits, and hands. Stomach and groin. Right leg and foot. Left leg and foot. Back of neck and back. Do not rinse. Let your skin air dry. Do not put anything on your body afterward, such as powder or perfume. Use lotion only as told by your provider. How to clean your surgery area with CHG cloths Follow these steps when using CHG cloths to clean before surgery unless your provider gives you other instructions: Using the CHG cloths, vigorously wash the part of your body where the surgery will be done. Use a back-and-forth motion for 2 minutes. The skin should be completely wet with CHG when you are done. Do not put anything on your body afterward, such as powder, lotion, or perfume. Put on clean clothes or pajamas. If it is the night before surgery, sleep in clean sheets. General tips Only use CHG cloths as told, and follow the instructions on the label. Use the CHG cloths on clean, dry skin. Do not  smoke and stay away from flames after using CHG. Your skin may feel sticky after using the CHG. This is normal. The sticky feeling will go away as the CHG dries. Do not use CHG: If you have a chlorhexidine allergy or have reacted to chlorhexidine in the past. On babies younger than 11 months of age. On your head, face, or genitals unless your provider tells you to. Contact a health care provider if: You have questions about using CHG.  Your skin gets irritated or itchy. You have a rash after using CHG. You swallow any CHG. Call your local poison control center ([phone] in the [place] Your eyes itch badly, or they become very red or swollen. Your hearing changes. You have trouble seeing. Get help right away if: You have swelling or tingling in your mouth or throat. You make high-pitched whistling sounds when you breathe, most often when you breathe out (wheeze). You have trouble breathing. These symptoms may be an emergency. Call 911right away. Do not wait to see if the symptoms will go away. Do not drive yourself to the hospital. This information is not intended to replace advice given to you by your health care provider. Make sure you discuss any questions you have with your health care provider. Document Revised: 05/13/2023 Document Reviewed: 05/09/2022 Elsevier Patient Education  2024 [employer].

## 2024-03-29 ENCOUNTER — Encounter (HOSPITAL_COMMUNITY): Payer: Self-pay

## 2024-03-29 ENCOUNTER — Encounter (HOSPITAL_COMMUNITY)
Admission: RE | Admit: 2024-03-29 | Discharge: 2024-03-29 | Disposition: A | Discharge status: Home / Self Care | Source: Ambulatory Visit | Attending: Obstetrics & Gynecology | Admitting: Obstetrics & Gynecology

## 2024-03-29 DIAGNOSIS — Z01812 Encounter for preprocedural laboratory examination: Secondary | ICD-10-CM | POA: Insufficient documentation

## 2024-03-29 DIAGNOSIS — Z01818 Encounter for other preprocedural examination: Secondary | ICD-10-CM

## 2024-03-29 LAB — CBC
HCT: 36.3 % (ref 36.0–46.0)
Hemoglobin: 12.5 g/dL (ref 12.0–15.0)
MCH: 33.1 pg (ref 26.0–34.0)
MCHC: 34.4 g/dL (ref 30.0–36.0)
MCV: 96 fL (ref 80.0–100.0)
Platelets: 263 10*3/uL (ref 150–400)
RBC: 3.78 MIL/uL — ABNORMAL LOW (ref 3.87–5.11)
RDW: 12 % (ref 11.5–15.5)
WBC: 9.5 10*3/uL (ref 4.0–10.5)
nRBC: 0 % (ref 0.0–0.2)

## 2024-03-29 LAB — COMPREHENSIVE METABOLIC PANEL WITH GFR
ALT: 24 U/L (ref 0–44)
AST: 24 U/L (ref 15–41)
Albumin: 3.7 g/dL (ref 3.5–5.0)
Alkaline Phosphatase: 46 U/L (ref 38–126)
Anion gap: 6 (ref 5–15)
BUN: 10 mg/dL (ref 6–20)
CO2: 23 mmol/L (ref 22–32)
Calcium: 8.8 mg/dL — ABNORMAL LOW (ref 8.9–10.3)
Chloride: 104 mmol/L (ref 98–111)
Creatinine, Ser: 0.67 mg/dL (ref 0.44–1.00)
GFR, Estimated: >60 mL/min (ref >60)
Glucose, Bld: 76 mg/dL (ref 70–99)
Potassium: 3.3 mmol/L — ABNORMAL LOW (ref 3.5–5.1)
Sodium: 133 mmol/L — ABNORMAL LOW (ref 135–145)
Total Bilirubin: 0.7 mg/dL (ref 0.0–1.2)
Total Protein: 7 g/dL (ref 6.5–8.1)

## 2024-03-29 LAB — URINALYSIS, ROUTINE W REFLEX MICROSCOPIC
Bilirubin Urine: NEGATIVE
Glucose, UA: NEGATIVE mg/dL
Hgb urine dipstick: NEGATIVE
Ketones, ur: NEGATIVE mg/dL
Nitrite: NEGATIVE
Protein, ur: NEGATIVE mg/dL
Specific Gravity, Urine: 1.016 (ref 1.005–1.030)
pH: 6 (ref 5.0–8.0)

## 2024-03-29 LAB — RAPID HIV SCREEN (HIV 1/2 AB+AG)
HIV 1/2 Antibodies: NONREACTIVE
HIV-1 P24 Antigen - HIV24: NONREACTIVE

## 2024-03-29 LAB — PREGNANCY, URINE: Preg Test, Ur: NEGATIVE

## 2024-03-30 NOTE — Progress Notes (Signed)
 Robotic lap Bilateral Salpingectomy will be cancelled until we can get clearance from Neurology per anesthesia.

## 2024-03-31 ENCOUNTER — Encounter (HOSPITAL_COMMUNITY): Admission: RE | Payer: Self-pay | Source: Home / Self Care

## 2024-03-31 ENCOUNTER — Ambulatory Visit (HOSPITAL_COMMUNITY): Admission: RE | Admit: 2024-03-31 | Source: Home / Self Care | Admitting: Obstetrics & Gynecology

## 2024-03-31 SURGERY — SALPINGECTOMY, ROBOT-ASSISTED
Anesthesia: General | Laterality: Bilateral

## 2024-04-01 ENCOUNTER — Other Ambulatory Visit: Payer: Self-pay | Admitting: Obstetrics & Gynecology

## 2024-04-01 DIAGNOSIS — G935 Compression of brain: Secondary | ICD-10-CM

## 2024-04-01 DIAGNOSIS — I671 Cerebral aneurysm, nonruptured: Secondary | ICD-10-CM

## 2024-06-25 ENCOUNTER — Encounter: Payer: Self-pay | Admitting: Diagnostic Neuroimaging

## 2024-06-25 ENCOUNTER — Ambulatory Visit (INDEPENDENT_AMBULATORY_CARE_PROVIDER_SITE_OTHER): Admitting: Diagnostic Neuroimaging

## 2024-06-25 VITALS — BP 119/74 | HR 105 | Ht 63.0 in | Wt 134.0 lb

## 2024-06-25 DIAGNOSIS — I671 Cerebral aneurysm, nonruptured: Secondary | ICD-10-CM | POA: Diagnosis not present

## 2024-06-25 NOTE — Patient Instructions (Signed)
  Cerebral aneurysm (unruptured, asymptomatic) - check CTA head - refer to endovascular neurosurgery  CHIARI MALFORMATION - asymptomatic, incidental finding  PRE-SURGERY NEUROLOGY CLEARANCE / EVALUATION (tubal ligation) - likely low risk from neurology standpoint; would recommend to complete aneurysm evaluation before tubal ligation procedure

## 2024-06-25 NOTE — Progress Notes (Signed)
 [facility]  PATIENT: Rachael Espinoza DOB: 14-Mar-1996  REFERRING CLINICIAN: Jayne Vonn DEL, MD HISTORY FROM: patient  REASON FOR VISIT: new consult   HISTORICAL  CHIEF COMPLAINT:  Chief Complaint  Patient presents with   Chiari malformation     Rm 7 alone Pt is well and stable, she is interesting in getting fallopian tubes removed and needs clearance due to aneurysm. Pt has no symptoms or concerns.     HISTORY OF PRESENT ILLNESS:   [age]an-28-year-old female here for evaluation of preoperative neurology evaluation with history of cerebral aneurysm and Chiari malformation.  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  This was monitored over time.  She went to [facility] neurosurgery for evaluation and was recommended to monitor this aneurysm until she completed her childbearing timeframe due to need for long-term medications afterwards.  Now patient planning to have tubal ligation.  Anesthesiologist and surgeon requested neurology evaluation before surgery.  Denies any headaches, numbness, weakness, vision issues.   REVIEW OF SYSTEMS: Full 14 system review of systems performed and negative with exception of: As per HPI.  ALLERGIES: No Known Allergies  HOME MEDICATIONS: Outpatient Medications Prior to Visit  Medication Sig Dispense Refill   ASHWAGANDHA GUMMIES PO Take 1 capsule by mouth 2 (two) times a week. With gaba and l-theanine     ibuprofen  (ADVIL ) 200 MG tablet Take 200 mg by mouth every 6 (six) hours as needed for moderate pain (pain score 4-6).     UNABLE TO FIND Take 1 Dose by mouth 2 (two) times a week. Black Seed Oil with Vit D3 and K2-1 liquid     No facility-administered medications prior to visit.    PAST MEDICAL HISTORY: Past Medical History:  Diagnosis Date   ASCUS of cervix with negative high risk HPV 05/10/2022   05/10/22 repeat pap in 3 years per  ASCCP guidelines, 5 year risk for CIN 3+ is 0.40%   Chiari malformation    Contraceptive management 08/25/2014   Headache(784.0)    Intracranial aneurysm    diagnosed when she was 28 years old   Pinched nerve    Supervision of normal pregnancy 08/13/2016    [facility] Initiated Care at   10 weeks FOB  Starr Hussar 20 yo HM 2nd child Dating By   LMP and US  Pap  08/13/16  GC/CT Initial:                36+wks: Genetic Screen NT/IT:  CF screen  Anatomic US   Flu vaccine  Tdap Recommended ~ 28wks Glucose Screen  2 hr GBS  Feed Preference  Contraception  Circumcision  Childbirth Classes  Pediatrician     Vasovagal syncope     PAST SURGICAL HISTORY: Past Surgical History:  Procedure Laterality Date   WISDOM TOOTH EXTRACTION  2015    FAMILY HISTORY: Family History  Problem Relation Age of Onset   Other Maternal Grandmother        cushing's   Cancer Maternal Grandfather        skin   Cancer Father        lung; kidney   Hypertension Mother    Thyroid disease Mother    Cancer Maternal Aunt        cervical cancer   Cancer Other        cervical; maternal great grandma   Heart disease Other  Cancer Other        renal cell    SOCIAL HISTORY: Social History   Socioeconomic History   Marital status: Single    Spouse name: Not on file   Number of children: 2   Years of education: Not on file   Highest education level: Not on file  Occupational History   Not on file  Tobacco Use   Smoking status: Former    Current packs/day: 0.00    Types: Cigarettes   Smokeless tobacco: Never  Vaping Use   Vaping status: Some Days  Substance and Sexual Activity   Alcohol use: Yes    Comment: occ   Drug use: No    Comment: pot in past   Sexual activity: Not Currently    Birth control/protection: Condom  Other Topics Concern   Not on file  Social History Narrative   2025- R handed, lives with children, one cup of caffeine daily.    Social Drivers of Research scientist (physical sciences) Strain: Not on file  Food Insecurity: Not on file  Transportation Needs: Not on file  Physical Activity: Not on file  Stress: Not on file  Social Connections: Not on file  Intimate Partner Violence: Not on file     PHYSICAL EXAM  GENERAL EXAM/CONSTITUTIONAL: Vitals:  Vitals:   06/25/24 0934  BP: 119/74  Pulse: (!) 105  Weight: 134 lb (60.8 kg)  Height: 5' 3 (1.6 m)   Body mass index is 23.74 kg/m. Wt Readings from Last 3 Encounters:  06/25/24 134 lb (60.8 kg)  03/29/24 130 lb 15.3 oz (59.4 kg)  01/22/24 131 lb (59.4 kg)   Patient is in no distress; well developed, nourished and groomed; neck is supple  CARDIOVASCULAR: Examination of carotid arteries is normal; no carotid bruits Regular rate and rhythm, no murmurs Examination of peripheral vascular system by observation and palpation is normal  EYES: Ophthalmoscopic exam of optic discs and posterior segments is normal; no papilledema or hemorrhages No results found.  MUSCULOSKELETAL: Gait, strength, tone, movements noted in Neurologic exam below  NEUROLOGIC: MENTAL STATUS:      No data to display         awake, alert, oriented to person, place and time recent and remote memory intact normal attention and concentration language fluent, comprehension intact, naming intact fund of knowledge appropriate  CRANIAL NERVE:  2nd - no papilledema on fundoscopic exam 2nd, 3rd, 4th, 6th - pupils equal and reactive to light, visual fields full to confrontation, extraocular muscles intact, no nystagmus 5th - facial sensation symmetric 7th - facial strength symmetric 8th - hearing intact 9th - palate elevates symmetrically, uvula midline 11th - shoulder shrug symmetric 12th - tongue protrusion midline  MOTOR:  normal bulk and tone, full strength in the BUE, BLE  SENSORY:  normal and symmetric to light touch, temperature, vibration  COORDINATION:  finger-nose-finger, fine finger movements  normal  REFLEXES:  deep tendon reflexes present and symmetric  GAIT/STATION:  narrow based gait     DIAGNOSTIC DATA (LABS, IMAGING, TESTING) - I reviewed patient records, labs, notes, testing and imaging myself where available.  Lab Results  Component Value Date   WBC 9.5 03/29/2024   HGB 12.5 03/29/2024   HCT 36.3 03/29/2024   MCV 96.0 03/29/2024   PLT 263 03/29/2024      Component Value Date/Time   NA 133 (L) 03/29/2024 0910   NA 139 08/15/2023 0837   K 3.3 (L) 03/29/2024 0910  CL 104 03/29/2024 0910   CO2 23 03/29/2024 0910   GLUCOSE 76 03/29/2024 0910   BUN 10 03/29/2024 0910   BUN 11 08/15/2023 0837   CREATININE 0.67 03/29/2024 0910   CALCIUM  8.8 (L) 03/29/2024 0910   PROT 7.0 03/29/2024 0910   PROT 7.6 08/15/2023 0837   ALBUMIN 3.7 03/29/2024 0910   ALBUMIN 4.6 08/15/2023 0837   AST 24 03/29/2024 0910   ALT 24 03/29/2024 0910   ALKPHOS 46 03/29/2024 0910   BILITOT 0.7 03/29/2024 0910   BILITOT 0.4 08/15/2023 0837   GFRNONAA >60 03/29/2024 0910   Lab Results  Component Value Date   CHOL 165 08/15/2023   HDL 46 08/15/2023   LDLCALC 104 (H) 08/15/2023   TRIG 79 08/15/2023   CHOLHDL 3.6 08/15/2023   No results found for: HGBA1C No results found for: VITAMINB12 Lab Results  Component Value Date   TSH 1.350 08/15/2023    02/10/15 MRI brain / MRA head  - 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 Otherwise normal appearance of the brain itself. 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 Previous measurements were 6.8 x 9.7 mm. Today this measures 9 x 10.7 mm. This is a worrisome trend and should be considered in the setting of potential treatments.   ASSESSMENT AND PLAN  28 y.o. year old female here with:   Dx:  1. Cerebral aneurysm without rupture      PLAN:  Cerebral aneurysm (unruptured,  asymptomatic) - check CTA head - refer to endovascular neurosurgery  CHIARI MALFORMATION - asymptomatic, incidental finding  PRE-SURGERY NEUROLOGY CLEARANCE / EVALUATION (tubal ligation) - likely low risk from neurology standpoint; would recommend to complete aneurysm evaluation before tubal ligation procedure  Orders Placed This Encounter  Procedures   CT ANGIO HEAD W OR WO CONTRAST   Ambulatory referral to Neurosurgery   Return for pending if symptoms worsen or fail to improve, pending test results.    EDUARD FABIENE HANLON, MD 06/25/2024, 10:15 AM Certified in Neurology, Neurophysiology and Neuroimaging  [facility] [address] [place], [place] [address] [phone]

## 2024-06-28 ENCOUNTER — Telehealth: Payer: Self-pay | Admitting: Diagnostic Neuroimaging

## 2024-06-28 NOTE — Telephone Encounter (Signed)
 Referral to [facility] was sent through Epic and it show she is scheduled there on 8/26 at 9am to see JANJUA, RASHID.

## 2024-06-30 ENCOUNTER — Encounter: Payer: Self-pay | Admitting: Diagnostic Neuroimaging

## 2024-06-30 DIAGNOSIS — R55 Syncope and collapse: Secondary | ICD-10-CM | POA: Insufficient documentation

## 2024-07-01 ENCOUNTER — Other Ambulatory Visit

## 2024-07-02 ENCOUNTER — Ambulatory Visit
Admission: RE | Admit: 2024-07-02 | Discharge: 2024-07-02 | Disposition: A | Discharge status: Home / Self Care | Source: Ambulatory Visit | Attending: Diagnostic Neuroimaging | Admitting: Diagnostic Neuroimaging

## 2024-07-02 DIAGNOSIS — I671 Cerebral aneurysm, nonruptured: Secondary | ICD-10-CM

## 2024-07-02 MED ORDER — IOPAMIDOL (ISOVUE-370) INJECTION 76%
100.0000 mL | Freq: Once | INTRAVENOUS | Status: AC | PRN
  Administered 2024-07-02: 75 mL via INTRAVENOUS

## 2024-07-02 NOTE — Progress Notes (Signed)
 Rachael Espinoza

## 2024-07-05 ENCOUNTER — Other Ambulatory Visit: Payer: Self-pay

## 2024-07-05 ENCOUNTER — Inpatient Hospital Stay
Admission: RE | Admit: 2024-07-05 | Discharge: 2024-07-05 | Disposition: A | Discharge status: Home / Self Care | Payer: Self-pay | Source: Ambulatory Visit | Attending: Neurosurgery | Admitting: Neurosurgery

## 2024-07-05 DIAGNOSIS — Z049 Encounter for examination and observation for unspecified reason: Secondary | ICD-10-CM

## 2024-07-06 ENCOUNTER — Other Ambulatory Visit: Payer: Self-pay

## 2024-07-06 ENCOUNTER — Ambulatory Visit
Admission: RE | Admit: 2024-07-06 | Discharge: 2024-07-06 | Disposition: A | Discharge status: Home / Self Care | Payer: Self-pay | Source: Ambulatory Visit | Attending: Neurosurgery | Admitting: Neurosurgery

## 2024-07-06 ENCOUNTER — Ambulatory Visit (INDEPENDENT_AMBULATORY_CARE_PROVIDER_SITE_OTHER): Admitting: Neurosurgery

## 2024-07-06 ENCOUNTER — Encounter: Payer: Self-pay | Admitting: Neurosurgery

## 2024-07-06 VITALS — BP 108/75 | HR 81 | Ht 63.0 in | Wt 132.0 lb

## 2024-07-06 DIAGNOSIS — I671 Cerebral aneurysm, nonruptured: Secondary | ICD-10-CM | POA: Diagnosis not present

## 2024-07-06 DIAGNOSIS — Z049 Encounter for examination and observation for unspecified reason: Secondary | ICD-10-CM

## 2024-07-06 NOTE — Addendum Note (Signed)
 Addended by: Joson Sapp on: 07/06/2024 08:38 AM   Modules accepted: Orders

## 2024-07-06 NOTE — Progress Notes (Signed)
 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  I have explained that this type of aneurysm does not cause subarachnoid hemorrhage.  I have explained that the most common symptom from the aneurysm is double vision as it often first affects the 6 cranial nerve.  I have explained that the aneurysms often cause headaches when they become very large.  I have explained that there is a risk of bleeding, which causes a condition called a carotid cavernous fistula and that this could be serious.  On the other hand, I have explained that this event is highly unlikely.  I have explained that surgery at this point is optional as the aneurysm will not cause subarachnoid hemorrhage and as such there is no urgency for treatment.  On the other hand, given her young age, I think it is very likely the aneurysm will become symptomatic at some point.  I have explained the risks of the procedure to fix the aneurysm, including the most important complication of stroke, which I estimated around 1%.  I have explained that other symptoms may occur after treating the anxiety including headache and diplopia.  I did explain that she will need to be on aspirin and clopidogrel for 6 months, and that this frequently causes bruising.  It may cause other bleeding problems.  She understands the above and would like to proceed with the treatment.  She would like to have her sterilization procedure first.  This type of aneurysm does not present any contraindication for anesthesia.  As such she may proceed with the tubal ligation and we can treat the aneurysm afterwards at her convenience.   Chief Complaint: Patient was seen in consultation today for  Chief Complaint  Patient presents with   Follow-up   Referral    Pt states that she feels good.SABRASABRASABRAPt has had a aneurysm since  age of 64   at the request of Penumalli,Vikram R  Referring Physician(s): Penumalli,Vikram R  History of Present Illness: Rachael Espinoza is a 28 y.o. female.  At the age of 52 she had a fall which prompted neuroimaging which detected a cavernous aneurysm on the right.  At that point the aneurysm was about 10 mm.  She has had subsequent imaging with the most recent CTA from 07/02/2024 demonstrating a 14 mm right cavernous aneurysm.  She is not having any symptoms.  Does not have headaches or double vision.  There is no other significant past medical history.    Past Medical History:  Diagnosis Date   ASCUS of cervix with negative high risk HPV 05/10/2022   05/10/22 repeat pap in 3 years per ASCCP guidelines, 5 year risk for CIN 3+ is 0.40%   Chiari malformation    Contraceptive management 08/25/2014   Headache(784.0)    Intracranial aneurysm    diagnosed when she was 28 years old   Pinched nerve    Supervision of normal pregnancy 08/13/2016    [facility] at   10 weeks FOB  Starr Hussar 20 yo HM 2nd child Dating By   LMP and US  Pap  08/13/16  GC/CT Initial:                36+wks: Genetic Screen NT/IT:  CF screen  Anatomic US   Flu vaccine  Tdap Recommended ~ 28wks Glucose Screen  2 hr GBS  Feed Preference  Contraception  Circumcision  Childbirth Classes  Pediatrician     Vasovagal syncope     Past Surgical History:  Procedure Laterality Date   WISDOM TOOTH EXTRACTION  2015    Allergies: Patient has no known allergies.  Medications: Prior to Admission medications   Not on File     Family History  Problem Relation Age of Onset   Other Maternal Grandmother        cushing's   Cancer Maternal Grandfather        skin   Cancer Father        lung; kidney   Hypertension Mother    Thyroid disease Mother    Cancer Maternal Aunt        cervical cancer   Cancer Other        cervical; maternal great grandma   Heart disease Other    Cancer Other         renal cell    Social History   Socioeconomic History   Marital status: Single    Spouse name: Not on file   Number of children: 2   Years of education: Not on file   Highest education level: Not on file  Occupational History   Not on file  Tobacco Use   Smoking status: Former    Current packs/day: 0.00    Types: Cigarettes   Smokeless tobacco: Never  Vaping Use   Vaping status: Some Days  Substance and Sexual Activity   Alcohol use: Yes    Comment: occ   Drug use: No    Comment: pot in past   Sexual activity: Not Currently    Birth control/protection: Condom  Other Topics Concern   Not on file  Social History Narrative   2025- R handed, lives with children, one cup of caffeine daily.    Social Drivers of Corporate investment banker Strain: Not on file  Food Insecurity: Not on file  Transportation Needs: Not on file  Physical Activity: Not on file  Stress: Not on file  Social Connections: Not on file    Review of Systems  Respiratory:  Negative for shortness of breath.   Cardiovascular:  Negative for chest pain.  Gastrointestinal:  Negative for blood in stool.  Genitourinary:  Negative for hematuria.    Vital Signs: BP 108/75   Pulse 81   Ht 5' 3 (1.6 m)   Wt 132 lb (59.9 kg)   LMP 07/02/2024   SpO2 99%   BMI 23.38 kg/m   Physical Exam Constitutional:      Appearance: Normal appearance. She is normal weight.  Cardiovascular:     Rate and Rhythm: Normal rate and regular rhythm.     Pulses: Normal pulses.     Heart sounds: Normal heart sounds.  Neurological:     Mental Status: She is alert.     Comments: Alert and oriented with normal speech expression, fluency and comprehension. Visual fields are full to confrontation.   Face is symmetric. Strength in the arms and legs is symmetric with no drift.  Sensation is normal and symmetric. No ataxia. No inattention.      Imaging: CT ANGIO HEAD W OR WO CONTRAST Result Date: 07/03/2024 EXAM:  CTA Head With Intravenous Contrast. CT Head without Contrast. CLINICAL HISTORY: Cerebral aneurysm follow up. Chiari malformation. TECHNIQUE: Axial CTA images of the head with intravenous contrast. Three-dimensional MIP/volume rendered reformations were performed. Axial computed tomography images of the head/brain performed without intravenous contrast. Dose reduction technique was  used including one or more of the following: automated exposure control, adjustment of mA and kV according to patient size, and/or iterative reconstruction. CONTRAST: 75 mL Isovue  370. COMPARISON: Head CT 08/22/2022, head MRI and MRA 02/10/2015. FINDINGS: CT HEAD: BRAIN: There is no evidence of an acute infarct, intracranial hemorrhage, mass, midline shift, or extra-axial fluid collection. Normal size and configuration of the ventricles. 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 ORBITS: The orbits are unremarkable. SINUSES AND MASTOIDS: The paranasal sinuses and mastoid air cells are clear. CTA HEAD: INTERNAL CAROTID ARTERIES: Widely patent. 14 x 10 mm aneurysm predominantly projecting medially from the right cavernous ICA, enlarged from 2016 (previously 11 x 9 mm). ANTERIOR CEREBRAL ARTERIES: No significant stenosis. No occlusion. No aneurysm. MIDDLE CEREBRAL ARTERIES: No significant stenosis. No occlusion. No aneurysm. POSTERIOR CEREBRAL ARTERIES: No significant stenosis. No occlusion. No aneurysm. BASILAR ARTERY: No significant stenosis. No occlusion. No aneurysm. VERTEBRAL ARTERIES: No significant stenosis. No occlusion. No aneurysm. SOFT TISSUES: No acute finding. BONES: No acute osseous abnormality. IMPRESSION: 1. 14 x 10 mm right cavernous ICA aneurysm, mildly enlarged from 2016. 2. Chiari I malformation. Electronically signed by: Dasie Hamburg MD 07/03/2024 11:24 AM EDT RP Workstation: [id number]    Labs:  CBC: Recent Labs    08/15/23 0837 03/29/24 0910  WBC 6.6 9.5  HGB 14.1 12.5  HCT  42.0 36.3  PLT 282 263    COAGS: No results for input(s): INR, APTT in the last 8760 hours.  BMP: Recent Labs    08/15/23 0837 03/29/24 0910  NA 139 133*  K 4.7 3.3*  CL 102 104  CO2 21 23  GLUCOSE 86 76  BUN 11 10  CALCIUM  9.6 8.8*  CREATININE 1.00 0.67  GFRNONAA  --  >60    LIVER FUNCTION TESTS: Recent Labs    08/15/23 0837 03/29/24 0910  BILITOT 0.4 0.7  AST 18 24  ALT 20 24  ALKPHOS 70 46  PROT 7.6 7.0  ALBUMIN 4.6 3.7    TUMOR MARKERS: No results for input(s): AFPTM, CEA, CA199, CHROMGRNA in the last 8760 hours.  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  I have explained that this type of aneurysm does not cause subarachnoid hemorrhage.  I have explained that the most common symptom from the aneurysm is double vision as it often first affects the 6 cranial nerve.  I have explained that the aneurysms often cause headaches when they become very large.  I have explained that there is a risk of bleeding, which causes a condition called a carotid cavernous fistula and that this could be serious.  On the other hand, I have explained that this event is highly unlikely.  I have explained that surgery at this point is optional as the aneurysm will not cause subarachnoid hemorrhage and as such there is no urgency for treatment.  On the other hand, given her young age, I think it is very likely the aneurysm will become symptomatic at some point.  I have explained the risks of the procedure to fix the aneurysm, including the most important complication of stroke, which I estimated around 1%.  I have explained that other symptoms may occur after treating the anxiety including headache and diplopia.  She understands the above and would like to proceed with the treatment.  She would like to have her  sterilization procedure first.  This type of aneurysm does not present any contraindication for anesthesia.   Electronically Signed: Nancyann LULLA Burns  07/06/2024, 11:50 AM

## 2024-07-07 ENCOUNTER — Ambulatory Visit: Payer: Self-pay | Admitting: Diagnostic Neuroimaging

## 2024-08-03 ENCOUNTER — Encounter: Payer: Self-pay | Admitting: Obstetrics & Gynecology

## 2024-08-03 ENCOUNTER — Ambulatory Visit: Admitting: Obstetrics & Gynecology

## 2024-08-03 VITALS — BP 121/74 | HR 86 | Ht 63.0 in | Wt 132.0 lb

## 2024-08-03 DIAGNOSIS — Z3009 Encounter for other general counseling and advice on contraception: Secondary | ICD-10-CM

## 2024-08-03 NOTE — Progress Notes (Signed)
 Follow up appointment Tubal schedule  Chief Complaint  Patient presents with   Pre-op Exam    Discuss tubal    Blood pressure 121/74, pulse 86, height 5' 3 (1.6 m), weight 132 lb (59.9 kg).  Neurology consult is done and cleared for surgery Will get stent after her tubal and be on blood thinners so neurology recommends before the stent placement  Schedule RA BS 08/17/24  MEDS ordered this encounter: No orders of the defined types were placed in this encounter.   Orders for this encounter: No orders of the defined types were placed in this encounter.  Maurilio,   Please schedule Kimberly L Ellner for surgery and contact her regarding pre op dates/times, etc::  Procedure:  Robot assisted laparoscopic bilateral salpingectomy for permanent sterilization  Diagnosis:  multiparous female desires permanent sterilization  Date or dates requested:  08/17/24 [facility]  Thanks,  Dr Jayne Impression + Management Plan   ICD-10-CM   1. Encounter for consultation for female sterilization: 08/17/24  Z30.09       Follow Up: Return in about 24 days (around 08/27/2024) for Post Op, with Dr Jayne.     All questions were answered.  Past Medical History:  Diagnosis Date   ASCUS of cervix with negative high risk HPV 05/10/2022   05/10/22 repeat pap in 3 years per ASCCP guidelines, 5 year risk for CIN 3+ is 0.40%   Chiari malformation    Contraceptive management 08/25/2014   Headache(784.0)    Intracranial aneurysm    diagnosed when she was 28 years old   Pinched nerve    Supervision of normal pregnancy 08/13/2016    [facility] at   10 weeks FOB  Starr Hussar 20 yo HM 2nd child Dating By   LMP and US  Pap  08/13/16  GC/CT Initial:                36+wks: Genetic Screen NT/IT:  CF screen  Anatomic US   Flu vaccine  Tdap Recommended ~ 28wks Glucose Screen  2 hr GBS  Feed Preference  Contraception  Circumcision  Childbirth Classes  Pediatrician     Vasovagal syncope      Past Surgical History:  Procedure Laterality Date   WISDOM TOOTH EXTRACTION  2015    OB History     Gravida  2   Para  2   Term  2   Preterm      AB      Living  2      SAB      IAB      Ectopic      Multiple  0   Live Births  2           No Known Allergies  Social History   Socioeconomic History   Marital status: Single    Spouse name: Not on file   Number of children: 2   Years of education: Not on file   Highest education level: Not on file  Occupational History   Not on file  Tobacco Use   Smoking status: Former    Current packs/day: 0.00    Types: Cigarettes   Smokeless tobacco: Never  Vaping Use   Vaping status: Some Days  Substance and Sexual Activity   Alcohol use: Yes    Comment: occ   Drug use: No    Comment: pot in past   Sexual activity: Not Currently    Birth control/protection: Condom  Other Topics  Concern   Not on file  Social History Narrative   2025- R handed, lives with children, one cup of caffeine daily.    Social Drivers of Corporate investment banker Strain: Not on file  Food Insecurity: Not on file  Transportation Needs: Not on file  Physical Activity: Not on file  Stress: Not on file  Social Connections: Not on file    Family History  Problem Relation Age of Onset   Other Maternal Grandmother        cushing's   Cancer Maternal Grandfather        skin   Cancer Father        lung; kidney   Hypertension Mother    Thyroid disease Mother    Cancer Maternal Aunt        cervical cancer   Cancer Other        cervical; maternal great grandma   Heart disease Other    Cancer Other        renal cell

## 2024-08-04 ENCOUNTER — Telehealth (HOSPITAL_COMMUNITY): Payer: Self-pay

## 2024-08-04 ENCOUNTER — Other Ambulatory Visit (HOSPITAL_COMMUNITY): Payer: Self-pay | Admitting: Neuroradiology

## 2024-08-04 ENCOUNTER — Other Ambulatory Visit: Payer: Self-pay | Admitting: Neuroradiology

## 2024-08-04 ENCOUNTER — Other Ambulatory Visit: Payer: Self-pay

## 2024-08-04 DIAGNOSIS — I671 Cerebral aneurysm, nonruptured: Secondary | ICD-10-CM

## 2024-08-04 MED ORDER — CLOPIDOGREL BISULFATE 75 MG PO TABS: 75.0000 mg | ORAL_TABLET | Freq: Every day | ORAL | 0 refills | Status: AC

## 2024-08-04 MED ORDER — ASPIRIN 325 MG PO TBEC: 325.0000 mg | DELAYED_RELEASE_TABLET | Freq: Every day | ORAL | 0 refills | Status: AC

## 2024-08-04 NOTE — Telephone Encounter (Signed)
 Called to schedule procedure with Dr. Lester, no answer, left vm. AB

## 2024-08-05 ENCOUNTER — Other Ambulatory Visit (HOSPITAL_COMMUNITY): Payer: Self-pay | Admitting: Obstetrics & Gynecology

## 2024-08-05 DIAGNOSIS — Z01818 Encounter for other preprocedural examination: Secondary | ICD-10-CM

## 2024-08-06 ENCOUNTER — Telehealth: Payer: Self-pay

## 2024-08-06 ENCOUNTER — Other Ambulatory Visit: Payer: Self-pay | Admitting: Neuroradiology

## 2024-08-06 NOTE — Telephone Encounter (Signed)
 Notified pt to start Plavix  one week 08/23/24 before surgery and to start aspirin  two days 08/28/24 before surgery.

## 2024-08-09 ENCOUNTER — Encounter: Payer: Self-pay | Admitting: *Deleted

## 2024-08-11 NOTE — Patient Instructions (Signed)
 Rachael Espinoza  08/11/2024     @PREFPERIOPPHARMACY @   Your procedure is scheduled on  08/17/2024.   Report to [facility] at  1120 A.M.   Call this number if you have problems the morning of surgery:  [phone]  If you experience any cold or flu symptoms such as cough, fever, chills, shortness of breath, etc. between now and your scheduled surgery, please notify us  at the above number.   Remember:  Do not eat after midnight.    You may drink clear liquids until  0920 am on 08/17/2024.      Clear liquids allowed are:                    Water, Juice (No red color; non-citric and without pulp; diabetics please choose diet or no sugar options), Carbonated beverages (diabetics please choose diet or no sugar options), Clear Tea (No creamer, milk, or cream, including half & half and powdered creamer), Black Coffee Only (No creamer, milk or cream, including half & half and powdered creamer), and Clear Sports drink (No red color; diabetics please choose diet or no sugar options)           At 0920 am on 08/17/2024 drink your carb drink. You can have nothing else after this.    Take these medicines the morning of surgery with A SIP OF WATER                                                      None.    Do not wear jewelry, make-up or nail polish, including gel polish,  artificial nails, or any other type of covering on natural nails (fingers and  toes).  Do not wear lotions, powders, or perfumes, or deodorant.  Do not shave 48 hours prior to surgery.  Men may shave face and neck.  Do not bring valuables to the hospital.  [facility] is not responsible for any belongings or valuables.  Contacts, dentures or bridgework may not be worn into surgery.  Leave your suitcase in the car.  After surgery it may be brought to your room.  For patients admitted to the hospital, discharge time will be determined by your treatment team.  Patients discharged the day of surgery will not be  allowed to drive home and must have someone with them for 24 hours.    Special instructions:   DO NOT smoke tobacco or vape for 24 hours before your procedure.  Please read over the following fact sheets that you were given. Coughing and Deep Breathing, Surgical Site Infection Prevention, Anesthesia Post-op Instructions, and Care and Recovery After Surgery       Surgery to Take Out One or Both Fallopian Tubes (Salpingectomy): What to Know After After one or both of your fallopian tubes are taken out, you may have pain in your belly and light bleeding from your vagina for a few days. You may also feel tired for a few days. Your recovery time will depend on the method that was used in your surgery. Follow these instructions at home: Medicines Take your medicines only as told. You may need to take steps to help treat or prevent trouble pooping (constipation), such as: Taking medicines to help you poop. Eating foods high in fiber, like beans,  whole grains, and fresh fruits and vegetables. Drinking more fluids as told. Ask your health care provider if it's safe to drive or use machines while taking your medicine. Caring for your cuts from surgery  Take care of the cuts in your belly as told. Make sure you: Wash your hands with soap and water for at least 20 seconds before and after you change your bandage. If you can't use soap and water, use hand sanitizer. Change your bandage. Leave stitches, staples, or skin glue alone. Leave tape strips alone unless you're told to take them off. You may trim the edges of the tape strips if they curl up. Check the cuts on your belly every day for signs of infection. Check for: More redness, swelling, or pain. More fluid or blood. Warmth. Pus or a bad smell. Activity Rest as told. Get up to take short walks at least every 2 hours during the day. This helps you breathe better and keeps your blood flowing. Ask for help if you feel weak or unsteady. Do  not lift anything heavier than 10 lb (4.5 kg) until you're told it's OK. Do not take baths, swim, or use a hot tub until you're told it's OK. Ask if you can shower. Ask what things are safe for you to do at home. Ask when you can go back to work or school. General instructions Do not smoke, vape, or use nicotine or tobacco. Wear compression stockings to reduce swelling and help prevent blood clots in your legs. Your provider may give you more instructions. Make sure you know what you can and can't do. Contact a health care provider if: You have pain when you pee. You have symptoms of infection in the cuts in your belly. You have a fever. You have pain in your belly that gets worse or does not get better with medicine. You have a rash. You feel light-headed. You throw up or feel like throwing up. Get help right away if: You have pain in your chest or leg. You have shortness of breath. You faint. You have more bleeding from your vagina that soaks one pad in an hour. These symptoms may be an emergency. Call 911 right away. Do not wait to see if the symptoms will go away. Do not drive yourself to the hospital. This information is not intended to replace advice given to you by your health care provider. Make sure you discuss any questions you have with your health care provider. Document Revised: 06/09/2023 Document Reviewed: 06/09/2023 Elsevier Patient Education  2024 [employer].General Anesthesia, Adult, Care After The following information offers guidance on how to care for yourself after your procedure. Your health care provider may also give you more specific instructions. If you have problems or questions, contact your health care provider. What can I expect after the procedure? After the procedure, it is common for people to: Have pain or discomfort at the IV site. Have nausea or vomiting. Have a sore throat or hoarseness. Have trouble concentrating. Feel cold or chills. Feel  weak, sleepy, or tired (fatigue). Have soreness and body aches. These can affect parts of the body that were not involved in surgery. Follow these instructions at home: For the time period you were told by your health care provider:  Rest. Do not participate in activities where you could fall or become injured. Do not drive or use machinery. Do not drink alcohol. Do not take sleeping pills or medicines that cause drowsiness. Do not make important  decisions or sign legal documents. Do not take care of children on your own. General instructions Drink enough fluid to keep your urine pale yellow. If you have sleep apnea, surgery and certain medicines can increase your risk for breathing problems. Follow instructions from your health care provider about wearing your sleep device: Anytime you are sleeping, including during daytime naps. While taking prescription pain medicines, sleeping medicines, or medicines that make you drowsy. Return to your normal activities as told by your health care provider. Ask your health care provider what activities are safe for you. Take over-the-counter and prescription medicines only as told by your health care provider. Do not use any products that contain nicotine or tobacco. These products include cigarettes, chewing tobacco, and vaping devices, such as e-cigarettes. These can delay incision healing after surgery. If you need help quitting, ask your health care provider. Contact a health care provider if: You have nausea or vomiting that does not get better with medicine. You vomit every time you eat or drink. You have pain that does not get better with medicine. You cannot urinate or have bloody urine. You develop a skin rash. You have a fever. Get help right away if: You have trouble breathing. You have chest pain. You vomit blood. These symptoms may be an emergency. Get help right away. Call 911. Do not wait to see if the symptoms will go away. Do  not drive yourself to the hospital. Summary After the procedure, it is common to have a sore throat, hoarseness, nausea, vomiting, or to feel weak, sleepy, or fatigue. For the time period you were told by your health care provider, do not drive or use machinery. Get help right away if you have difficulty breathing, have chest pain, or vomit blood. These symptoms may be an emergency. This information is not intended to replace advice given to you by your health care provider. Make sure you discuss any questions you have with your health care provider. Document Revised: 01/25/2022 Document Reviewed: 01/25/2022 Elsevier Patient Education  2024 Else[employer] Inc.How to Use Chlorhexidine  at Home in the Shower Chlorhexidine  gluconate (CHG) is a germ-killing (antiseptic) wash that's used to clean the skin. It can get rid of the germs that normally live on the skin and can keep them away for about 24 hours. If you're having surgery, you may be told to shower with CHG at home the night before surgery. This can help lower your risk for infection. To use CHG wash in the shower, follow the steps below. Supplies needed: CHG body wash. Clean washcloth. Clean towel. How to use CHG in the shower Follow these steps unless you're told to use CHG in a different way: Start the shower. Use your normal soap and shampoo to wash your face and hair. Turn off the shower or move out of the shower stream. Pour CHG onto a clean washcloth. Do not use any type of brush or rough sponge. Start at your neck, washing your body down to your toes. Make sure you: Wash the part of your body where the surgery will be done for at least 1 minute. Do not scrub. Do not use CHG on your head or face unless your health care provider tells you to. If it gets into your ears or eyes, rinse them well with water. Do not wash your genitals with CHG. Wash your back and under your arms. Make sure to wash skin folds. Let the CHG sit on your skin for  1-2 minutes or as  long as told. Rinse your entire body in the shower, including all body creases and folds. Turn off the shower. Dry off with a clean towel. Do not put anything on your skin afterward, such as powder, lotion, or perfume. Put on clean clothes or pajamas. If it's the night before surgery, sleep in clean sheets. General tips Use CHG only as told, and follow the instructions on the label. Use the full amount of CHG as told. This is often one bottle. Do not smoke and stay away from flames after using CHG. Your skin may feel sticky after using CHG. This is normal. The sticky feeling will go away as the CHG dries. Do not use CHG: If you have a chlorhexidine  allergy or have reacted to chlorhexidine  in the past. On open wounds or areas of skin that have broken skin, cuts, or scrapes. On babies younger than 61 months of age. Contact a health care provider if: You have questions about using CHG. Your skin gets irritated or itchy. You have a rash after using CHG. You swallow any CHG. Call your local poison control center [phone] in the [place] Your eyes itch badly, or they become very red or swollen. Your hearing changes. You have trouble seeing. If you can't reach your provider, go to an urgent care or emergency room. Do not drive yourself. Get help right away if: You have swelling or tingling in your mouth or throat. You make high-pitched whistling sounds when you breathe, most often when you breathe out (wheeze). You have trouble breathing. These symptoms may be an emergency. Call 911 right away. Do not wait to see if the symptoms will go away. Do not drive yourself to the hospital. This information is not intended to replace advice given to you by your health care provider. Make sure you discuss any questions you have with your health care provider. Document Revised: 05/13/2023 Document Reviewed: 05/09/2022 Elsevier Patient Education  2024 [employer].

## 2024-08-12 ENCOUNTER — Encounter (HOSPITAL_COMMUNITY): Payer: Self-pay

## 2024-08-12 ENCOUNTER — Other Ambulatory Visit: Payer: Self-pay

## 2024-08-12 ENCOUNTER — Encounter (HOSPITAL_COMMUNITY)
Admission: RE | Admit: 2024-08-12 | Discharge: 2024-08-12 | Disposition: A | Discharge status: Home / Self Care | Source: Ambulatory Visit | Attending: Obstetrics & Gynecology | Admitting: Obstetrics & Gynecology

## 2024-08-12 DIAGNOSIS — Z01812 Encounter for preprocedural laboratory examination: Secondary | ICD-10-CM | POA: Diagnosis not present

## 2024-08-12 DIAGNOSIS — Z01818 Encounter for other preprocedural examination: Secondary | ICD-10-CM

## 2024-08-12 LAB — CBC
HCT: 39 % (ref 36.0–46.0)
Hemoglobin: 13.2 g/dL (ref 12.0–15.0)
MCH: 31.9 pg (ref 26.0–34.0)
MCHC: 33.8 g/dL (ref 30.0–36.0)
MCV: 94.2 fL (ref 80.0–100.0)
Platelets: 302 K/uL (ref 150–400)
RBC: 4.14 MIL/uL (ref 3.87–5.11)
RDW: 11.9 % (ref 11.5–15.5)
WBC: 8.3 K/uL (ref 4.0–10.5)
nRBC: 0 % (ref 0.0–0.2)

## 2024-08-12 LAB — RAPID HIV SCREEN (HIV 1/2 AB+AG)
HIV 1/2 Antibodies: NONREACTIVE
HIV-1 P24 Antigen - HIV24: NONREACTIVE

## 2024-08-12 LAB — COMPREHENSIVE METABOLIC PANEL WITH GFR
ALT: 22 U/L (ref 0–44)
AST: 20 U/L (ref 15–41)
Albumin: 4.6 g/dL (ref 3.5–5.0)
Alkaline Phosphatase: 52 U/L (ref 38–126)
Anion gap: 12 (ref 5–15)
BUN: 8 mg/dL (ref 6–20)
CO2: 22 mmol/L (ref 22–32)
Calcium: 9.2 mg/dL (ref 8.9–10.3)
Chloride: 103 mmol/L (ref 98–111)
Creatinine, Ser: 0.84 mg/dL (ref 0.44–1.00)
GFR, Estimated: >60 mL/min (ref >60)
Glucose, Bld: 98 mg/dL (ref 70–99)
Potassium: 3.5 mmol/L (ref 3.5–5.1)
Sodium: 137 mmol/L (ref 135–145)
Total Bilirubin: 0.4 mg/dL (ref 0.0–1.2)
Total Protein: 7.7 g/dL (ref 6.5–8.1)

## 2024-08-12 LAB — URINALYSIS, ROUTINE W REFLEX MICROSCOPIC
Bilirubin Urine: NEGATIVE
Glucose, UA: NEGATIVE mg/dL
Hgb urine dipstick: NEGATIVE
Ketones, ur: NEGATIVE mg/dL
Nitrite: NEGATIVE
Protein, ur: NEGATIVE mg/dL
Specific Gravity, Urine: 1.023 (ref 1.005–1.030)
pH: 6 (ref 5.0–8.0)

## 2024-08-12 LAB — PREGNANCY, URINE: Preg Test, Ur: NEGATIVE

## 2024-08-17 ENCOUNTER — Ambulatory Visit (HOSPITAL_COMMUNITY): Admitting: Certified Registered"

## 2024-08-17 ENCOUNTER — Ambulatory Visit (HOSPITAL_COMMUNITY)
Admission: RE | Admit: 2024-08-17 | Discharge: 2024-08-17 | Disposition: A | Discharge status: Home / Self Care | Attending: Obstetrics & Gynecology | Admitting: Obstetrics & Gynecology

## 2024-08-17 ENCOUNTER — Encounter (HOSPITAL_COMMUNITY)
Admission: RE | Disposition: A | Discharge status: Home / Self Care | Payer: Self-pay | Source: Home / Self Care | Attending: Obstetrics & Gynecology

## 2024-08-17 ENCOUNTER — Encounter (HOSPITAL_COMMUNITY): Payer: Self-pay | Admitting: Obstetrics & Gynecology

## 2024-08-17 DIAGNOSIS — Z302 Encounter for sterilization: Secondary | ICD-10-CM | POA: Diagnosis not present

## 2024-08-17 DIAGNOSIS — F17290 Nicotine dependence, other tobacco product, uncomplicated: Secondary | ICD-10-CM | POA: Diagnosis not present

## 2024-08-17 DIAGNOSIS — Z3009 Encounter for other general counseling and advice on contraception: Secondary | ICD-10-CM | POA: Diagnosis present

## 2024-08-17 HISTORY — PX: SAPINGECTOMY, ROBOT ASSISTED, LAPAROSCOPIC: SHX7403

## 2024-08-17 SURGERY — SAPINGECTOMY, ROBOT ASSISTED, LAPAROSCOPIC, D5
Anesthesia: General | Site: Abdomen | Laterality: Bilateral

## 2024-08-17 MED ORDER — LIDOCAINE HCL (CARDIAC) PF 100 MG/5ML IV SOSY
PREFILLED_SYRINGE | INTRAVENOUS | Status: DC | PRN
Start: 2024-08-17 — End: 2024-08-17
  Administered 2024-08-17: 50 mg via INTRATRACHEAL

## 2024-08-17 MED ORDER — ROCURONIUM BROMIDE 10 MG/ML (PF) SYRINGE
PREFILLED_SYRINGE | INTRAVENOUS | Status: DC | PRN
  Administered 2024-08-17: 10 mg via INTRAVENOUS
  Administered 2024-08-17: 50 mg via INTRAVENOUS

## 2024-08-17 MED ORDER — FENTANYL CITRATE (PF) 250 MCG/5ML IJ SOLN
INTRAMUSCULAR | Status: AC
  Filled 2024-08-17: qty 5

## 2024-08-17 MED ORDER — FENTANYL CITRATE PF 50 MCG/ML IJ SOSY: 25.0000 ug | PREFILLED_SYRINGE | INTRAMUSCULAR | Status: DC | PRN

## 2024-08-17 MED ORDER — BUPIVACAINE HCL (PF) 0.25 % IJ SOLN
INTRAMUSCULAR | Status: DC | PRN
Start: 2024-08-17 — End: 2024-08-17
  Administered 2024-08-17: 40 mL

## 2024-08-17 MED ORDER — PROPOFOL 10 MG/ML IV BOLUS
INTRAVENOUS | Status: AC
  Filled 2024-08-17: qty 20

## 2024-08-17 MED ORDER — KETOROLAC TROMETHAMINE 30 MG/ML IJ SOLN
INTRAMUSCULAR | Status: AC
  Administered 2024-08-17: 30 mg via INTRAVENOUS
  Filled 2024-08-17: qty 1

## 2024-08-17 MED ORDER — SCOPOLAMINE 1 MG/3DAYS TD PT72
1.0000 | MEDICATED_PATCH | Freq: Once | TRANSDERMAL | Status: DC
  Administered 2024-08-17: 1 mg via TRANSDERMAL

## 2024-08-17 MED ORDER — ONDANSETRON HCL 4 MG/2ML IJ SOLN
INTRAMUSCULAR | Status: AC
  Filled 2024-08-17: qty 2

## 2024-08-17 MED ORDER — LACTATED RINGERS IV SOLN: INTRAVENOUS | Status: DC

## 2024-08-17 MED ORDER — OXYCODONE HCL 5 MG/5ML PO SOLN: 5.0000 mg | Freq: Once | ORAL | Status: DC | PRN

## 2024-08-17 MED ORDER — DEXAMETHASONE SODIUM PHOSPHATE 10 MG/ML IJ SOLN
INTRAMUSCULAR | Status: AC
  Filled 2024-08-17: qty 1

## 2024-08-17 MED ORDER — CEFAZOLIN SODIUM-DEXTROSE 2-4 GM/100ML-% IV SOLN
2.0000 g | INTRAVENOUS | Status: AC
  Administered 2024-08-17: 2 g via INTRAVENOUS

## 2024-08-17 MED ORDER — DEXAMETHASONE SODIUM PHOSPHATE 10 MG/ML IJ SOLN
INTRAMUSCULAR | Status: DC | PRN
  Administered 2024-08-17: 4 mg via INTRAVENOUS

## 2024-08-17 MED ORDER — SCOPOLAMINE 1 MG/3DAYS TD PT72
MEDICATED_PATCH | TRANSDERMAL | Status: AC
  Filled 2024-08-17: qty 1

## 2024-08-17 MED ORDER — POVIDONE-IODINE 10 % EX SWAB
2.0000 | Freq: Once | CUTANEOUS | Status: AC
Start: 2024-08-17 — End: 2024-08-17
  Administered 2024-08-17: 2 via TOPICAL

## 2024-08-17 MED ORDER — CEFAZOLIN SODIUM-DEXTROSE 2-4 GM/100ML-% IV SOLN
INTRAVENOUS | Status: AC
  Filled 2024-08-17: qty 100

## 2024-08-17 MED ORDER — MIDAZOLAM HCL 2 MG/2ML IJ SOLN
INTRAMUSCULAR | Status: AC
  Filled 2024-08-17: qty 2

## 2024-08-17 MED ORDER — PHENYLEPHRINE HCL-NACL 20-0.9 MG/250ML-% IV SOLN
INTRAVENOUS | Status: DC | PRN
  Administered 2024-08-17 (×3): 80 ug via INTRAVENOUS

## 2024-08-17 MED ORDER — MIDAZOLAM HCL 2 MG/2ML IJ SOLN
INTRAMUSCULAR | Status: DC | PRN
  Administered 2024-08-17: 2 mg via INTRAVENOUS

## 2024-08-17 MED ORDER — ORAL CARE MOUTH RINSE: 15.0000 mL | Freq: Once | OROMUCOSAL | Status: AC

## 2024-08-17 MED ORDER — ONDANSETRON HCL 4 MG/2ML IJ SOLN
INTRAMUSCULAR | Status: DC | PRN
Start: 2024-08-17 — End: 2024-08-17
  Administered 2024-08-17: 4 mg via INTRAVENOUS

## 2024-08-17 MED ORDER — KETOROLAC TROMETHAMINE 30 MG/ML IJ SOLN
30.0000 mg | INTRAMUSCULAR | Status: AC
Start: 2024-08-17 — End: 2024-08-17

## 2024-08-17 MED ORDER — PROPOFOL 10 MG/ML IV BOLUS
INTRAVENOUS | Status: DC | PRN
  Administered 2024-08-17: 200 mg via INTRAVENOUS

## 2024-08-17 MED ORDER — ONDANSETRON 8 MG PO TBDP: 8.0000 mg | ORAL_TABLET | Freq: Three times a day (TID) | ORAL | 0 refills | Status: DC | PRN

## 2024-08-17 MED ORDER — OXYCODONE-ACETAMINOPHEN 5-325 MG PO TABS: 1.0000 | ORAL_TABLET | Freq: Four times a day (QID) | ORAL | 0 refills | Status: DC | PRN

## 2024-08-17 MED ORDER — CHLORHEXIDINE GLUCONATE 0.12 % MT SOLN
15.0000 mL | Freq: Once | OROMUCOSAL | Status: AC
  Administered 2024-08-17: 15 mL via OROMUCOSAL

## 2024-08-17 MED ORDER — KETOROLAC TROMETHAMINE 10 MG PO TABS: 10.0000 mg | ORAL_TABLET | Freq: Three times a day (TID) | ORAL | 0 refills | Status: DC | PRN

## 2024-08-17 MED ORDER — BUPIVACAINE HCL (PF) 0.25 % IJ SOLN
INTRAMUSCULAR | Status: AC
  Filled 2024-08-17: qty 60

## 2024-08-17 MED ORDER — FENTANYL CITRATE (PF) 100 MCG/2ML IJ SOLN
INTRAMUSCULAR | Status: DC | PRN
  Administered 2024-08-17: 100 ug via INTRAVENOUS
  Administered 2024-08-17 (×3): 50 ug via INTRAVENOUS

## 2024-08-17 MED ORDER — PHENYLEPHRINE 80 MCG/ML (10ML) SYRINGE FOR IV PUSH (FOR BLOOD PRESSURE SUPPORT)
PREFILLED_SYRINGE | INTRAVENOUS | Status: AC
  Filled 2024-08-17: qty 10

## 2024-08-17 MED ORDER — STERILE WATER FOR IRRIGATION IR SOLN
Status: DC | PRN
  Administered 2024-08-17: 1000 mL

## 2024-08-17 MED ORDER — OXYCODONE HCL 5 MG PO TABS: 5.0000 mg | ORAL_TABLET | Freq: Once | ORAL | Status: DC | PRN

## 2024-08-17 SURGICAL SUPPLY — 36 items
BLADE SURG SZ11 CARB STEEL (BLADE) ×1 IMPLANT
COVER LIGHT HANDLE STERIS (MISCELLANEOUS) ×2 IMPLANT
COVER MAYO STAND XLG (MISCELLANEOUS) ×1 IMPLANT
DERMABOND ADVANCED .7 DNX12 (GAUZE/BANDAGES/DRESSINGS) ×1 IMPLANT
DRAPE ARM DVNC X/XI (DISPOSABLE) ×3 IMPLANT
DRAPE COLUMN DVNC XI (DISPOSABLE) ×1 IMPLANT
ELECTRODE REM PT RTRN 9FT ADLT (ELECTROSURGICAL) ×1 IMPLANT
FORCEPS PROGRASP DVNC XI (FORCEP) ×1 IMPLANT
GLOVE BIOGEL PI IND STRL 7.0 (GLOVE) ×3 IMPLANT
GLOVE BIOGEL PI IND STRL 8 (GLOVE) ×2 IMPLANT
GLOVE ECLIPSE 8.0 STRL XLNG CF (GLOVE) ×2 IMPLANT
GOWN STRL REUS W/TWL LRG LVL3 (GOWN DISPOSABLE) ×2 IMPLANT
GOWN STRL REUS W/TWL XL LVL3 (GOWN DISPOSABLE) ×2 IMPLANT
KIT PINK PAD W/HEAD ARM REST (MISCELLANEOUS) ×1 IMPLANT
KIT TURNOVER KIT A (KITS) ×1 IMPLANT
MANIFOLD NEPTUNE II (INSTRUMENTS) ×1 IMPLANT
NDL HYPO 21X1.5 SAFETY (NEEDLE) ×1 IMPLANT
NDL INSUFFLATION 14GA 120MM (NEEDLE) ×1 IMPLANT
NEEDLE HYPO 21X1.5 SAFETY (NEEDLE) ×1 IMPLANT
NEEDLE INSUFFLATION 14GA 120MM (NEEDLE) ×1 IMPLANT
NS IRRIG 1000ML POUR BTL (IV SOLUTION) ×1 IMPLANT
OBTURATOR OPTICALSTD 8 DVNC (TROCAR) ×1 IMPLANT
PACK LAP CHOLE LZT030E (CUSTOM PROCEDURE TRAY) ×1 IMPLANT
POSITIONER HEAD 8X9X4 ADT (SOFTGOODS) ×1 IMPLANT
SEAL UNIV 5-12 XI (MISCELLANEOUS) ×3 IMPLANT
SEALER VESSEL EXT DVNC XI (MISCELLANEOUS) ×1 IMPLANT
SET BASIN LINEN APH (SET/KITS/TRAYS/PACK) ×1 IMPLANT
SET TUBE DA VINCI INSUFFLATOR (TUBING) ×1 IMPLANT
SOL ANTI FOG 6CC (MISCELLANEOUS) ×1 IMPLANT
SUT ETHILON 3 0 PS 1 (SUTURE) IMPLANT
SUT VICRYL 0 UR6 27IN ABS (SUTURE) IMPLANT
SUT VICRYL AB 3-0 FS1 BRD 27IN (SUTURE) ×1 IMPLANT
SYR 20ML LL LF (SYRINGE) ×1 IMPLANT
SYSTEM RETRIEVL 5MM INZII UNIV (BASKET) ×1 IMPLANT
TAPE TRANSPORE STRL 2 31045 (GAUZE/BANDAGES/DRESSINGS) ×1 IMPLANT
WATER STERILE IRR 500ML POUR (IV SOLUTION) ×1 IMPLANT

## 2024-08-17 NOTE — Op Note (Signed)
 Preoperative Diagnosis:  1.  Parous female desires permanent sterilization                                          2.  Elects to have bilateral salpingectomy for ovarian cancer prophylaxis   Postoperative Diagnosis:  Same as above   Procedure:  Robot assisted laparoscopic Bilateral Salpingectomy for the purpose of permanent sterilization                    Placement of a TAP block   Surgeon:  Vonn VEAR Jayne Mickey MD   Anaesthesia: general   Findings:  Patient had normal pelvic anatomy and no intraperitoneal abnormalities.   Description of Operation:  Patient was taken to the OR and placed into supine position where she underwent general anaesthesia.   She was  prepped and draped in the usual sterile fashion.   An incision was made superior to the umbilicus and dissection taken down to the rectus fascia. A Veres needle was used to insufflate the periotneal cavity. An 8 mm non bladed video laparoscope trocar port was then placed under direct visualization without difficulty.   The above noted findings were observed.   Two additional 8 mm non bladed trocar ports were placed to the right and left of the umbilicus lateral to the rectus anterior muscle under direct visualization without difficulty.      The  Robot was docked to the ports 30 degree scope was used in the umbilical port A vessel sealer estended was in the right port A prograsp was placed in the right port VSE was used to hemostatically remove both tubes for the ovarian attachment and to coagulate and cut across the mesosalpinx bilaterally The entire fimbria and Fallopian tube were removed all the way to the uterine cornua The mesosalpinx of both fallopian tubes were hemostatic The removed fallopian tubes were removed through the left 8 mm port using an alligator grasper  A transversus abdominus plane block was placed using laparoscopic guidance at T10 and T7 bilaterally, 10 cc of 0.25% bupivacaine  at each site, 40 cc total volume  and  100 mg total of bupivacaine .  Doyle's bubble technique was used.   The umbilical incision fascia and subcutaneous tissue was reapproximated with 2-0 Vicryl All 3 skin incisions were closed using 3-0 vicryl in a subcuticular fashion.  Dermabond was applied to each incision    The patient was awakened from anaesthesia and taken to the PACU with all counts being correct x 3.     The patient received  2 gram of ancef andToradol 30 mg IV preoperatively.   Blood loss was 0 cc    Vonn VEAR Jayne 08/17/2024 2:01 PM

## 2024-08-17 NOTE — Anesthesia Procedure Notes (Signed)
 Procedure Name: Intubation Date/Time: 08/17/2024 12:59 PM  Performed by: Pheobe Adine CROME, CRNAPre-anesthesia Checklist: Patient identified, Emergency Drugs available, Suction available, Patient being monitored and Timeout performed Patient Re-evaluated:Patient Re-evaluated prior to induction Oxygen Delivery Method: Circle system utilized Preoxygenation: Pre-oxygenation with 100% oxygen Induction Type: IV induction Ventilation: Mask ventilation without difficulty Laryngoscope Size: Miller and 2 Grade View: Grade I Tube type: Oral Tube size: 7.0 mm Number of attempts: 1 Airway Equipment and Method: Stylet Placement Confirmation: ETT inserted through vocal cords under direct vision, positive ETCO2, CO2 detector and breath sounds checked- equal and bilateral Secured at: 21 cm Tube secured with: Tape Dental Injury: Teeth and Oropharynx as per pre-operative assessment

## 2024-08-17 NOTE — H&P (Signed)
 Preoperative History and Physical  Rachael Espinoza is a 28 y.o. [id number] with Patient's last menstrual period was 07/28/2024 (exact date). admitted for a robot assisted laparoscopic bilateral salpingectomy for permanent sterilization.    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 old   Pinched nerve    Supervision of normal pregnancy 08/13/2016    [facility] Initiated Care at   10 weeks FOB  Starr Hussar 20 yo HM 2nd child Dating By   LMP and US  Pap  08/13/16  GC/CT Initial:                36+wks: Genetic Screen NT/IT:  CF screen  Anatomic US   Flu vaccine  Tdap Recommended ~ 28wks Glucose Screen  2 hr GBS  Feed Preference  Contraception  Circumcision  Childbirth Classes  Pediatrician     Vasovagal syncope     PSH:     Past Surgical History:  Procedure Laterality Date   WISDOM TOOTH EXTRACTION  2015    POb/GynH:      OB History     Gravida  2   Para  2   Term  2   Preterm      AB      Living  2      SAB      IAB      Ectopic      Multiple  0   Live Births  2           SH:   Social History   Tobacco Use   Smoking status: Former    Current packs/day: 0.00    Types: Cigarettes   Smokeless tobacco: Never  Vaping Use   Vaping status: Some Days  Substance Use Topics   Alcohol use: Yes    Comment: occ   Drug use: No    Comment: pot in past    FH:    Family History  Problem Relation Age of Onset   Other Maternal Grandmother        cushing's   Cancer Maternal Grandfather        skin   Cancer Father        lung; kidney   Hypertension Mother    Thyroid disease Mother    Cancer Maternal Aunt        cervical cancer   Cancer Other        cervical;  maternal great grandma   Heart disease Other    Cancer Other        renal cell     Allergies: No Known Allergies  Medications:       Current Facility-Administered Medications:    ceFAZolin (ANCEF) 2-4 GM/100ML-% IVPB, , , ,    ceFAZolin (ANCEF) IVPB 2g/100 mL premix, 2 g, Intravenous, On Call to OR, Ebert Forrester, Vonn DEL, MD   lactated ringers  infusion, , Intravenous, Continuous, Herschell Hollering, MD, Last Rate: 10 mL/hr at 08/17/24 1150, New Bag at 08/17/24 1150   scopolamine (TRANSDERM-SCOP) 1 MG/3DAYS 1 mg, 1 patch, Transdermal, Once, Herschell Hollering, MD, 1 mg at 08/17/24 1151   scopolamine (TRANSDERM-SCOP) 1 MG/3DAYS, , , ,  Review of Systems:   Review of Systems  Constitutional: Negative for fever, chills, weight loss, malaise/fatigue and diaphoresis.  HENT: Negative for hearing loss, ear pain, nosebleeds, congestion, sore throat, neck pain, tinnitus and ear discharge.   Eyes: Negative for blurred vision, double vision, photophobia, pain, discharge and redness.  Respiratory: Negative for cough, hemoptysis, sputum production, shortness of breath, wheezing and stridor.   Cardiovascular: Negative for chest pain, palpitations, orthopnea, claudication, leg swelling and PND.  Gastrointestinal: Positive for abdominal pain. Negative for heartburn, nausea, vomiting, diarrhea, constipation, blood in stool and melena.  Genitourinary: Negative for dysuria, urgency, frequency, hematuria and flank pain.  Musculoskeletal: Negative for myalgias, back pain, joint pain and falls.  Skin: Negative for itching and rash.  Neurological: Negative for dizziness, tingling, tremors, sensory change, speech change, focal weakness, seizures, loss of consciousness, weakness and headaches.  Endo/Heme/Allergies: Negative for environmental allergies and polydipsia. Does not bruise/bleed easily.  Psychiatric/Behavioral: Negative for depression, suicidal ideas, hallucinations, memory loss and substance abuse. The patient is  not nervous/anxious and does not have insomnia.      PHYSICAL EXAM:  Blood pressure 104/65, temperature 98.1 F (36.7 C), resp. rate 16, height 5' 3 (1.6 m), weight 59.8 kg, last menstrual period 07/28/2024, SpO2 100%.    Vitals reviewed. Constitutional: She is oriented to person, place, and time. She appears well-developed and well-nourished.  HENT:  Head: Normocephalic and atraumatic.  Right Ear: External ear normal.  Left Ear: External ear normal.  Nose: Nose normal.  Mouth/Throat: Oropharynx is clear and moist.  Eyes: Conjunctivae and EOM are normal. Pupils are equal, round, and reactive to light. Right eye exhibits no discharge. Left eye exhibits no discharge. No scleral icterus.  Neck: Normal range of motion. Neck supple. No tracheal deviation present. No thyromegaly present.  Cardiovascular: Normal rate, regular rhythm, normal heart sounds and intact distal pulses.  Exam reveals no gallop and no friction rub.   No murmur heard. Respiratory: Effort normal and breath sounds normal. No respiratory distress. She has no wheezes. She has no rales. She exhibits no tenderness.  GI: Soft. Bowel sounds are normal. She exhibits no distension and no mass. There is tenderness. There is no rebound and no guarding.  Genitourinary:       Vulva is normal without lesions Vagina is pink moist without discharge Cervix normal in appearance and pap is normal Uterus is normal size, contour, position, consistency, mobility, non-tender Adnexa is negative with normal sized ovaries by sonogram  Musculoskeletal: Normal range of motion. She exhibits no edema and no tenderness.  Neurological: She is alert and oriented to person, place, and time. She has normal reflexes. She displays normal reflexes. No cranial nerve deficit. She exhibits normal muscle tone. Coordination normal.  Skin: Skin is warm and dry. No rash noted. No erythema. No pallor.  Psychiatric: She has a normal mood and affect. Her behavior  is normal. Judgment and thought content normal.    Labs: Results for orders placed or performed during the hospital encounter of 08/12/24 (from the past 2 weeks)  CBC   Collection Time: 08/12/24  3:09 PM  Result Value Ref Range   WBC 8.3 4.0 - 10.5 K/uL   RBC 4.14 3.87 - 5.11 MIL/uL   Hemoglobin 13.2 12.0 - 15.0 g/dL   HCT 60.9 63.9 - 53.9 %   MCV 94.2 80.0 - 100.0 fL   MCH 31.9 26.0 - 34.0 pg   MCHC 33.8 30.0 - 36.0 g/dL   RDW 88.0 88.4 -  15.5 %   Platelets 302 150 - 400 K/uL   nRBC 0.0 0.0 - 0.2 %  Comprehensive metabolic panel   Collection Time: 08/12/24  3:09 PM  Result Value Ref Range   Sodium 137 135 - 145 mmol/L   Potassium 3.5 3.5 - 5.1 mmol/L   Chloride 103 98 - 111 mmol/L   CO2 22 22 - 32 mmol/L   Glucose, Bld 98 70 - 99 mg/dL   BUN 8 6 - 20 mg/dL   Creatinine, Ser 9.15 0.44 - 1.00 mg/dL   Calcium  9.2 8.9 - 10.3 mg/dL   Total Protein 7.7 6.5 - 8.1 g/dL   Albumin 4.6 3.5 - 5.0 g/dL   AST 20 15 - 41 U/L   ALT 22 0 - 44 U/L   Alkaline Phosphatase 52 38 - 126 U/L   Total Bilirubin 0.4 0.0 - 1.2 mg/dL   GFR, Estimated >39 >39 mL/min   Anion gap 12 5 - 15  Rapid HIV screen (HIV 1/2 Ab+Ag)   Collection Time: 08/12/24  3:09 PM  Result Value Ref Range   HIV-1 P24 Antigen - HIV24 NON REACTIVE NON REACTIVE   HIV 1/2 Antibodies NON REACTIVE NON REACTIVE   Interpretation (HIV Ag Ab)      A non reactive test result means that HIV 1 or HIV 2 antibodies and HIV 1 p24 antigen were not detected in the specimen.  Urinalysis, Routine w reflex microscopic -Urine, Clean Catch   Collection Time: 08/12/24  3:09 PM  Result Value Ref Range   Color, Urine YELLOW YELLOW   APPearance CLEAR CLEAR   Specific Gravity, Urine 1.023 1.005 - 1.030   pH 6.0 5.0 - 8.0   Glucose, UA NEGATIVE NEGATIVE mg/dL   Hgb urine dipstick NEGATIVE NEGATIVE   Bilirubin Urine NEGATIVE NEGATIVE   Ketones, ur NEGATIVE NEGATIVE mg/dL   Protein, ur NEGATIVE NEGATIVE mg/dL   Nitrite NEGATIVE NEGATIVE    Leukocytes,Ua TRACE (A) NEGATIVE   RBC / HPF 0-5 0 - 5 RBC/hpf   WBC, UA 0-5 0 - 5 WBC/hpf   Bacteria, UA RARE (A) NONE SEEN   Squamous Epithelial / HPF 6-10 0 - 5 /HPF   Mucus PRESENT   Pregnancy, urine   Collection Time: 08/12/24  3:09 PM  Result Value Ref Range   Preg Test, Ur NEGATIVE NEGATIVE    EKG: No orders found for this or any previous visit.  Imaging Studies: No results found.    Assessment: Multiparous female desires permanent sterilization Chiari malformation Cerebral aneurysm  Plan: RA laparoscopic bilateral salpingectomy  Vonn VEAR Inch 08/17/2024 12:14 PM

## 2024-08-17 NOTE — Discharge Instructions (Signed)

## 2024-08-17 NOTE — Anesthesia Preprocedure Evaluation (Addendum)
 Anesthesia Evaluation  Patient identified by MRN, date of birth, ID band Patient awake    Reviewed: Allergy & Precautions, H&P , NPO status , Patient's Chart, lab work & pertinent test results  Airway Mallampati: I  TM Distance: >3 FB Neck ROM: Full    Dental no notable dental hx.    Pulmonary former smoker   Pulmonary exam normal breath sounds clear to auscultation       Cardiovascular negative cardio ROS Normal cardiovascular exam Rhythm:Regular Rate:Normal     Neuro/Psych  Headaches Cerebral aneurysm dx at 14; to have stent placed in two weeks to prevent growth Pt denies any symptoms of dizziness, headache Chiari malformation Cleared by neurology for procedure today  negative psych ROS   GI/Hepatic negative GI ROS, Neg liver ROS,,,  Endo/Other  negative endocrine ROS    Renal/GU negative Renal ROS  negative genitourinary   Musculoskeletal negative musculoskeletal ROS (+)    Abdominal   Peds negative pediatric ROS (+)  Hematology negative hematology ROS (+)   Anesthesia Other Findings   Reproductive/Obstetrics negative OB ROS                              Anesthesia Physical Anesthesia Plan  ASA: 1  Anesthesia Plan: General   Post-op Pain Management:    Induction: Intravenous  PONV Risk Score and Plan: Scopolamine patch - Pre-op  Airway Management Planned: Oral ETT  Additional Equipment:   Intra-op Plan:   Post-operative Plan: Extubation in OR  Informed Consent: I have reviewed the patients History and Physical, chart, labs and discussed the procedure including the risks, benefits and alternatives for the proposed anesthesia with the patient or authorized representative who has indicated his/her understanding and acceptance.     Dental advisory given  Plan Discussed with: CRNA  Anesthesia Plan Comments:          Anesthesia Quick Evaluation

## 2024-08-17 NOTE — Transfer of Care (Signed)
 Immediate Anesthesia Transfer of Care Note  Patient: Rachael Espinoza  Procedure(s) Performed: SAPINGECTOMY, ROBOT ASSISTED, LAPAROSCOPIC, D5 (Bilateral: Abdomen)  Patient Location: PACU  Anesthesia Type:General  Level of Consciousness: awake, alert , oriented, and patient cooperative  Airway & Oxygen Therapy: Patient Spontanous Breathing  Post-op Assessment: Report given to RN and Post -op Vital signs reviewed and stable  Post vital signs: Reviewed and stable  Last Vitals:  Vitals Value Taken Time  BP 116/79 08/17/24 14:01  Temp 97.7   Pulse 133 08/17/24 14:03  Resp 14 08/17/24 14:03  SpO2 100 % 08/17/24 14:03  Vitals shown include unfiled device data.  Last Pain:  Vitals:   08/17/24 1152  PainSc: 0-No pain         Complications: No notable events documented.

## 2024-08-18 LAB — SURGICAL PATHOLOGY

## 2024-08-18 NOTE — Anesthesia Postprocedure Evaluation (Signed)
 Anesthesia Post Note  Patient: Rachael Espinoza  Procedure(s) Performed: SAPINGECTOMY, ROBOT ASSISTED, LAPAROSCOPIC, D5 (Bilateral: Abdomen)  Patient location during evaluation: PACU Anesthesia Type: General Level of consciousness: awake and alert Pain management: pain level controlled Vital Signs Assessment: post-procedure vital signs reviewed and stable Respiratory status: spontaneous breathing, nonlabored ventilation, respiratory function stable and patient connected to nasal cannula oxygen Cardiovascular status: blood pressure returned to baseline and stable Postop Assessment: no apparent nausea or vomiting Anesthetic complications: no   No notable events documented.   Last Vitals:  Vitals:   08/17/24 1439 08/17/24 1442  BP:  114/76  Pulse: 67   Resp: 16   Temp: 36.7 C   SpO2: 93% 100%    Last Pain:  Vitals:   08/17/24 1439  TempSrc: Oral  PainSc: 3                  Andrea Limes

## 2024-08-26 ENCOUNTER — Other Ambulatory Visit: Payer: Self-pay

## 2024-08-26 ENCOUNTER — Encounter (HOSPITAL_COMMUNITY): Payer: Self-pay | Admitting: Neuroradiology

## 2024-08-26 NOTE — Progress Notes (Signed)
 SDW CALL  Patient was given pre-op instructions over the phone. The opportunity was given for the patient to ask questions. No further questions asked. Patient verbalized understanding of instructions given.  Date & arrival time- August 30, 2024 @ 7:45  PCP - Alphonsa Glendia LABOR, MD  Cardiologist -   PPM/ICD - denies Device Orders - n/a Rep Notified - n/a  Chest x-ray - denies EKG - denies Stress Test - denies ECHO - denies Cardiac Cath - denies  Sleep Study - denies CPAP - n/a  DM -denies  Blood Thinner Instructions:clopidogrel  (PLAVIX )  Start date 08-23-24 Aspirin  Instructions: start date 08-1824  ERAS Protcol -NPO   COVID TEST- n/a   Anesthesia review: no  Patient denies shortness of breath, fever, cough and chest pain over the phone call   All instructions explained to the patient, with a verbal understanding of the material. Patient agrees to go over the instructions while at home for a better understanding.

## 2024-08-27 ENCOUNTER — Ambulatory Visit: Admitting: Obstetrics & Gynecology

## 2024-08-27 VITALS — BP 118/78 | HR 90

## 2024-08-27 DIAGNOSIS — Z9889 Other specified postprocedural states: Secondary | ICD-10-CM

## 2024-08-27 NOTE — Progress Notes (Signed)
  HPI: Patient returns for routine postoperative follow-up having undergone   (Z98.890) Post-operative state: RA BS 08/17/24  (primary encounter diagnosis)     The patient's immediate postoperative recovery has been unremarkable. Since hospital discharge the patient reports no problems.   Current Outpatient Medications: Acetaminophen -Caff-Pyrilamine (MIDOL  COMPLETE) 500-60-15 MG TABS, Take 1-2 tablets by mouth daily as needed (menstrual symptoms/cramps)., Disp: , Rfl:  aspirin  EC 325 MG tablet, Take 1 tablet (325 mg total) by mouth daily. Begin 2 days before surgery and take morning of surgery (Patient not taking: Reported on 08/27/2024), Disp: 30 tablet, Rfl: 0 clopidogrel  (PLAVIX ) 75 MG tablet, Take 1 tablet (75 mg total) by mouth daily for 180 doses. Begin 7 days before surgery and take morning of surgery, Disp: 180 tablet, Rfl: 0 ketorolac (TORADOL) 10 MG tablet, Take 1 tablet (10 mg total) by mouth every 8 (eight) hours as needed. (Patient not taking: Reported on 08/27/2024), Disp: 15 tablet, Rfl: 0 ondansetron  (ZOFRAN -ODT) 8 MG disintegrating tablet, Take 1 tablet (8 mg total) by mouth every 8 (eight) hours as needed for nausea or vomiting. (Patient not taking: Reported on 08/27/2024), Disp: 8 tablet, Rfl: 0 oxyCODONE -acetaminophen  (PERCOCET) 5-325 MG tablet, Take 1 tablet by mouth every 6 (six) hours as needed for severe pain (pain score 7-10). (Patient not taking: Reported on 08/27/2024), Disp: 21 tablet, Rfl: 0 Polyethyl Glycol-Propyl Glycol (LUBRICANT EYE DROPS) 0.4-0.3 % SOLN, Place 1-2 drops into both eyes 3 (three) times daily as needed (dry/irritated eyes.). (Patient not taking: Reported on 08/27/2024), Disp: , Rfl:   No current facility-administered medications for this visit.    Blood pressure 118/78, pulse 90, last menstrual period 08/23/2024.  Physical Exam: 3 incisions all look good Abdomen is benign  Diagnostic Tests:   Pathology: benign  Impression + Management  plan: (S01.109) Post-operative state: RA BS 08/17/24  (primary encounter diagnosis)       Medications Prescribed this encounter: No orders of the defined types were placed in this encounter.     Follow up: Return if symptoms worsen or fail to improve.    Vonn VEAR Inch, MD Attending Physician for the Center for [facility] and [facility] 08/27/2024 10:44 AM

## 2024-08-30 ENCOUNTER — Observation Stay (HOSPITAL_COMMUNITY)
Admission: RE | Admit: 2024-08-30 | Discharge: 2024-08-31 | Disposition: A | Attending: Neuroradiology | Admitting: Neuroradiology

## 2024-08-30 ENCOUNTER — Ambulatory Visit (HOSPITAL_COMMUNITY)

## 2024-08-30 ENCOUNTER — Other Ambulatory Visit: Payer: Self-pay

## 2024-08-30 ENCOUNTER — Encounter (HOSPITAL_COMMUNITY)
Admission: RE | Disposition: A | Discharge status: Home / Self Care | Payer: Self-pay | Source: Home / Self Care | Attending: Neuroradiology

## 2024-08-30 ENCOUNTER — Ambulatory Visit: Payer: Self-pay | Admitting: Neuroradiology

## 2024-08-30 ENCOUNTER — Ambulatory Visit (HOSPITAL_COMMUNITY)
Admission: RE | Admit: 2024-08-30 | Discharge: 2024-08-30 | Disposition: A | Source: Ambulatory Visit | Attending: Neuroradiology | Admitting: Neuroradiology

## 2024-08-30 ENCOUNTER — Encounter (HOSPITAL_COMMUNITY): Payer: Self-pay | Admitting: Neuroradiology

## 2024-08-30 DIAGNOSIS — Z95828 Presence of other vascular implants and grafts: Secondary | ICD-10-CM | POA: Diagnosis not present

## 2024-08-30 DIAGNOSIS — Z87891 Personal history of nicotine dependence: Secondary | ICD-10-CM | POA: Diagnosis not present

## 2024-08-30 DIAGNOSIS — I720 Aneurysm of carotid artery: Secondary | ICD-10-CM

## 2024-08-30 DIAGNOSIS — Z7982 Long term (current) use of aspirin: Secondary | ICD-10-CM | POA: Insufficient documentation

## 2024-08-30 DIAGNOSIS — Q0700 Arnold-Chiari syndrome without spina bifida or hydrocephalus: Secondary | ICD-10-CM

## 2024-08-30 DIAGNOSIS — I671 Cerebral aneurysm, nonruptured: Secondary | ICD-10-CM | POA: Diagnosis not present

## 2024-08-30 DIAGNOSIS — Z7902 Long term (current) use of antithrombotics/antiplatelets: Secondary | ICD-10-CM | POA: Insufficient documentation

## 2024-08-30 HISTORY — PX: RADIOLOGY WITH ANESTHESIA: SHX6223

## 2024-08-30 HISTORY — PX: IR ANGIOGRAM FOLLOW UP STUDY: IMG697

## 2024-08-30 HISTORY — PX: IR US GUIDE VASC ACCESS RIGHT: IMG2390

## 2024-08-30 HISTORY — PX: IR NEURO EACH ADD'L AFTER BASIC UNI RIGHT (MS): IMG5374

## 2024-08-30 HISTORY — PX: IR TRANSCATH/EMBOLIZ: IMG695

## 2024-08-30 LAB — TYPE AND SCREEN
ABO/RH(D): O NEG
Antibody Screen: NEGATIVE

## 2024-08-30 LAB — POCT ACTIVATED CLOTTING TIME
Activated Clotting Time: 285 s
Activated Clotting Time: 221 s
Activated Clotting Time: 250 s

## 2024-08-30 LAB — POCT PREGNANCY, URINE: Preg Test, Ur: NEGATIVE

## 2024-08-30 SURGERY — RADIOLOGY WITH ANESTHESIA
Anesthesia: General

## 2024-08-30 MED ORDER — OXYCODONE HCL 5 MG/5ML PO SOLN: 5.0000 mg | Freq: Once | ORAL | Status: DC | PRN

## 2024-08-30 MED ORDER — CHLORHEXIDINE GLUCONATE 0.12 % MT SOLN
OROMUCOSAL | Status: AC
  Filled 2024-08-30: qty 15

## 2024-08-30 MED ORDER — FENTANYL CITRATE (PF) 250 MCG/5ML IJ SOLN
INTRAMUSCULAR | Status: DC | PRN
  Administered 2024-08-30 (×2): 50 ug via INTRAVENOUS

## 2024-08-30 MED ORDER — ASPIRIN 325 MG PO TBEC
325.0000 mg | DELAYED_RELEASE_TABLET | Freq: Every day | ORAL | Status: DC
  Administered 2024-08-31: 325 mg via ORAL
  Filled 2024-08-30: qty 1

## 2024-08-30 MED ORDER — CHLORHEXIDINE GLUCONATE 0.12 % MT SOLN
15.0000 mL | Freq: Once | OROMUCOSAL | Status: AC
  Administered 2024-08-30: 15 mL via OROMUCOSAL

## 2024-08-30 MED ORDER — HEPARIN SODIUM (PORCINE) 1000 UNIT/ML IJ SOLN
INTRAMUSCULAR | Status: DC | PRN
  Administered 2024-08-30: 1000 [IU] via INTRAVENOUS
  Administered 2024-08-30: 4000 [IU] via INTRAVENOUS

## 2024-08-30 MED ORDER — PHENYLEPHRINE 80 MCG/ML (10ML) SYRINGE FOR IV PUSH (FOR BLOOD PRESSURE SUPPORT)
PREFILLED_SYRINGE | INTRAVENOUS | Status: DC | PRN
  Administered 2024-08-30 (×4): 80 ug via INTRAVENOUS

## 2024-08-30 MED ORDER — PHENYLEPHRINE HCL-NACL 20-0.9 MG/250ML-% IV SOLN
INTRAVENOUS | Status: DC | PRN
  Administered 2024-08-30: 25 ug/min via INTRAVENOUS

## 2024-08-30 MED ORDER — ONDANSETRON HCL 4 MG/2ML IJ SOLN
INTRAMUSCULAR | Status: DC | PRN
  Administered 2024-08-30: 4 mg via INTRAVENOUS

## 2024-08-30 MED ORDER — ACETAMINOPHEN 160 MG/5ML PO SOLN: 650.0000 mg | ORAL | Status: DC | PRN

## 2024-08-30 MED ORDER — LIDOCAINE 2% (20 MG/ML) 5 ML SYRINGE
INTRAMUSCULAR | Status: DC | PRN
  Administered 2024-08-30: 50 mg via INTRAVENOUS

## 2024-08-30 MED ORDER — ORAL CARE MOUTH RINSE: 15.0000 mL | Freq: Once | OROMUCOSAL | Status: AC

## 2024-08-30 MED ORDER — ROCURONIUM BROMIDE 10 MG/ML (PF) SYRINGE
PREFILLED_SYRINGE | INTRAVENOUS | Status: DC | PRN
  Administered 2024-08-30: 30 mg via INTRAVENOUS
  Administered 2024-08-30 (×2): 10 mg via INTRAVENOUS
  Administered 2024-08-30: 40 mg via INTRAVENOUS
  Administered 2024-08-30: 20 mg via INTRAVENOUS

## 2024-08-30 MED ORDER — SODIUM CHLORIDE 0.9 % IV BOLUS: 250.0000 mL | INTRAVENOUS | Status: DC | PRN

## 2024-08-30 MED ORDER — MIDAZOLAM HCL 2 MG/2ML IJ SOLN
INTRAMUSCULAR | Status: AC
  Filled 2024-08-30: qty 2

## 2024-08-30 MED ORDER — ACETAMINOPHEN 325 MG PO TABS: 650.0000 mg | ORAL_TABLET | ORAL | Status: DC | PRN

## 2024-08-30 MED ORDER — DEXAMETHASONE SOD PHOSPHATE PF 10 MG/ML IJ SOLN
INTRAMUSCULAR | Status: DC | PRN
Start: 2024-08-30 — End: 2024-08-30
  Administered 2024-08-30: 10 mg via INTRAVENOUS

## 2024-08-30 MED ORDER — FENTANYL CITRATE (PF) 100 MCG/2ML IJ SOLN
INTRAMUSCULAR | Status: AC
  Filled 2024-08-30: qty 2

## 2024-08-30 MED ORDER — PROPOFOL 10 MG/ML IV BOLUS
INTRAVENOUS | Status: DC | PRN
  Administered 2024-08-30: 150 mg via INTRAVENOUS

## 2024-08-30 MED ORDER — SUGAMMADEX SODIUM 200 MG/2ML IV SOLN
INTRAVENOUS | Status: DC | PRN
  Administered 2024-08-30: 200 mg via INTRAVENOUS

## 2024-08-30 MED ORDER — SODIUM CHLORIDE 0.9 % IV SOLN: INTRAVENOUS | Status: DC

## 2024-08-30 MED ORDER — ACETAMINOPHEN 10 MG/ML IV SOLN: 1000.0000 mg | Freq: Once | INTRAVENOUS | Status: DC | PRN

## 2024-08-30 MED ORDER — CLOPIDOGREL BISULFATE 75 MG PO TABS
75.0000 mg | ORAL_TABLET | Freq: Every day | ORAL | Status: DC
  Administered 2024-08-31: 75 mg via ORAL
  Filled 2024-08-30: qty 1

## 2024-08-30 MED ORDER — DROPERIDOL 2.5 MG/ML IJ SOLN: 0.6250 mg | Freq: Once | INTRAMUSCULAR | Status: DC | PRN

## 2024-08-30 MED ORDER — LACTATED RINGERS IV SOLN: INTRAVENOUS | Status: DC | PRN

## 2024-08-30 MED ORDER — NITROGLYCERIN 1 MG/10 ML FOR IR/CATH LAB
INTRA_ARTERIAL | Status: AC
  Filled 2024-08-30: qty 10

## 2024-08-30 MED ORDER — IOHEXOL 300 MG/ML  SOLN
150.0000 mL | Freq: Once | INTRAMUSCULAR | Status: AC | PRN
  Administered 2024-08-30: 54 mL via INTRA_ARTERIAL

## 2024-08-30 MED ORDER — MIDAZOLAM HCL (PF) 2 MG/2ML IJ SOLN
INTRAMUSCULAR | Status: DC | PRN
  Administered 2024-08-30: 2 mg via INTRAVENOUS

## 2024-08-30 MED ORDER — VERAPAMIL HCL 2.5 MG/ML IV SOLN: INTRA_ARTERIAL | Status: DC | PRN

## 2024-08-30 MED ORDER — OXYCODONE HCL 5 MG PO TABS: 5.0000 mg | ORAL_TABLET | Freq: Once | ORAL | Status: DC | PRN

## 2024-08-30 MED ORDER — VERAPAMIL HCL 2.5 MG/ML IV SOLN
INTRAVENOUS | Status: AC
  Filled 2024-08-30: qty 2

## 2024-08-30 MED ORDER — FENTANYL CITRATE (PF) 100 MCG/2ML IJ SOLN: 25.0000 ug | INTRAMUSCULAR | Status: DC | PRN

## 2024-08-30 MED ORDER — VERAPAMIL HCL 2.5 MG/ML IV SOLN
INTRAVENOUS | Status: DC | PRN
  Administered 2024-08-30: 2.5 mg via INTRA_ARTERIAL

## 2024-08-30 MED ORDER — ACETAMINOPHEN 650 MG RE SUPP: 650.0000 mg | RECTAL | Status: DC | PRN

## 2024-08-30 NOTE — H&P (Signed)
 I have reviewed my history and physical exam from 07/06/2024.  Briefly, she has had a interval bilateral salpingectomy for sterilization.  There have been no other changes to her medical history.  I have confirmed that she has taken her Plavix  since last Tuesday, including this morning, which would be a total of 7 doses.  She also took her full dose aspirin  starting 2 days ago, and also this morning.  She feels well this morning other than being tired.  Her lungs are clear.  Heart rate is regular with normal heart sounds.  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  I have explained that this type of aneurysm does not cause subarachnoid hemorrhage.  I have explained that the most common symptom from the aneurysm is double vision as it often first affects the 6 cranial nerve.  I have explained that the aneurysms often cause headaches when they become very large.  I have explained that there is a risk of bleeding, which causes a condition called a carotid cavernous fistula and that this could be serious.  On the other hand, I have explained that this event is highly unlikely.   I have explained that surgery at this point is optional as the aneurysm will not cause subarachnoid hemorrhage and as such there is no urgency for treatment.  On the other hand, given her young age, I think it is very likely the aneurysm will become symptomatic at some point.   I have explained the risks of the procedure to fix the aneurysm, including the most important complication of stroke, which I estimated around 1%.  I have explained that other symptoms may occur after treating the anxiety including headache and diplopia.   I did explain that she will need to be on aspirin  and clopidogrel  for 6 months, and that this frequently  causes bruising.  It may cause other bleeding problems.   She understands the above and would like to proceed with the treatment.  She would like to have her sterilization procedure first.  This type of aneurysm does not present any contraindication for anesthesia.  As such she may proceed with the tubal ligation and we can treat the aneurysm afterwards at her convenience.     Chief Complaint: Patient was seen in consultation today for      Chief Complaint  Patient presents with   Follow-up   Referral      Pt states that she feels good.SABRASABRASABRAPt has had a aneurysm since age of 24   at the request of Penumalli,Vikram R   Referring Physician(s): Penumalli,Vikram R   History of Present Illness: Talajah Slimp Chronis is a 28 y.o. female.  At the age of 28 she had a fall which prompted neuroimaging which detected a cavernous aneurysm on the right.  At that point the aneurysm was about 10 mm.  She has had subsequent imaging with the most recent CTA from 07/02/2024 demonstrating a 14 mm right cavernous aneurysm.   She is not having any symptoms.  Does not have headaches or double vision.   There is no other significant past medical history.           Past Medical History:  Diagnosis Date   ASCUS of cervix with negative high risk HPV 05/10/2022    05/10/22 repeat pap in 3 years  per ASCCP guidelines, 5 year risk for CIN 3+ is 0.40%   Chiari malformation     Contraceptive management 08/25/2014   Headache(784.0)     Intracranial aneurysm      diagnosed when she was 28 years old   Pinched nerve     Supervision of normal pregnancy 08/13/2016     Clinic  [facility] at              10 weeks FOB            Starr Hussar 20 yo HM 2nd child Dating By         LMP and US  Pap       08/13/16  GC/CT          Initial:                36+wks: Genetic Screen    NT/IT:  CF screen      Anatomic US    Flu vaccine      Tdap   Recommended ~ 28wks Glucose Screen       2 hr GBS            Feed Preference          Contraception  Circumcision   Childbirth Classes       Pediatrician        Vasovagal syncope                 Past Surgical History:  Procedure Laterality Date   WISDOM TOOTH EXTRACTION   2015          Allergies: Patient has no known allergies.   Medications: Prior to Admission medications   Not on File           Family History  Problem Relation Age of Onset   Other Maternal Grandmother          cushing's   Cancer Maternal Grandfather          skin   Cancer Father          lung; kidney   Hypertension Mother     Thyroid disease Mother     Cancer Maternal Aunt          cervical cancer   Cancer Other          cervical; maternal great grandma   Heart disease Other     Cancer Other          renal cell          Social History         Socioeconomic History   Marital status: Single      Spouse name: Not on file   Number of children: 2   Years of education: Not on file   Highest education level: Not on file  Occupational History   Not on file  Tobacco Use   Smoking status: Former      Current packs/day: 0.00      Types: Cigarettes   Smokeless tobacco: Never  Vaping Use   Vaping status: Some Days  Substance and Sexual Activity   Alcohol use: Yes      Comment: occ   Drug use: No      Comment: pot in past   Sexual activity: Not Currently      Birth control/protection: Condom  Other Topics Concern   Not on file  Social History Narrative    2025- R handed, lives with children, one cup of caffeine  daily.     Social Drivers of Manufacturing engineer Strain: Not on file  Food Insecurity: Not on file  Transportation Needs: Not on file  Physical Activity: Not on file  Stress: Not on file  Social Connections: Not on file      Review of Systems  Respiratory:  Negative for shortness of breath.   Cardiovascular:  Negative for chest pain.  Gastrointestinal:  Negative for blood in stool.  Genitourinary:  Negative for hematuria.     Vital  Signs: BP 108/75   Pulse 81   Ht 5' 3 (1.6 m)   Wt 132 lb (59.9 kg)   LMP 07/02/2024   SpO2 99%   BMI 23.38 kg/m    Physical Exam Constitutional:      Appearance: Normal appearance. She is normal weight.  Cardiovascular:     Rate and Rhythm: Normal rate and regular rhythm.     Pulses: Normal pulses.     Heart sounds: Normal heart sounds.  Neurological:     Mental Status: She is alert.     Comments: Alert and oriented with normal speech expression, fluency and comprehension. Visual fields are full to confrontation.   Face is symmetric. Strength in the arms and legs is symmetric with no drift.  Sensation is normal and symmetric. No ataxia. No inattention.        Imaging:  Imaging Results  CT ANGIO HEAD W OR WO CONTRAST Result Date: 07/03/2024 EXAM: CTA Head With Intravenous Contrast. CT Head without Contrast. CLINICAL HISTORY: Cerebral aneurysm follow up. Chiari malformation. TECHNIQUE: Axial CTA images of the head with intravenous contrast. Three-dimensional MIP/volume rendered reformations were performed. Axial computed tomography images of the head/brain performed without intravenous contrast. Dose reduction technique was used including one or more of the following: automated exposure control, adjustment of mA and kV according to patient size, and/or iterative reconstruction. CONTRAST: 75 mL Isovue  370. COMPARISON: Head CT 08/22/2022, head MRI and MRA 02/10/2015. FINDINGS: CT HEAD: BRAIN: There is no evidence of an acute infarct, intracranial hemorrhage, mass, midline shift, or extra-axial fluid collection. Normal size and configuration of the ventricles. 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 ORBITS: The orbits are unremarkable. SINUSES AND MASTOIDS: The paranasal sinuses and mastoid air cells are clear. CTA HEAD: INTERNAL CAROTID ARTERIES: Widely patent. 14 x 10 mm aneurysm predominantly projecting medially from the right cavernous ICA,  enlarged from 2016 (previously 11 x 9 mm). ANTERIOR CEREBRAL ARTERIES: No significant stenosis. No occlusion. No aneurysm. MIDDLE CEREBRAL ARTERIES: No significant stenosis. No occlusion. No aneurysm. POSTERIOR CEREBRAL ARTERIES: No significant stenosis. No occlusion. No aneurysm. BASILAR ARTERY: No significant stenosis. No occlusion. No aneurysm. VERTEBRAL ARTERIES: No significant stenosis. No occlusion. No aneurysm. SOFT TISSUES: No acute finding. BONES: No acute osseous abnormality. IMPRESSION: 1. 14 x 10 mm right cavernous ICA aneurysm, mildly enlarged from 2016. 2. Chiari I malformation. Electronically signed by: Dasie Hamburg MD 07/03/2024 11:24 AM EDT RP Workstation: [id number]       Labs:   CBC: Recent Labs (within last 365 days)      Recent Labs    08/15/23 0837 03/29/24 0910  WBC 6.6 9.5  HGB 14.1 12.5  HCT 42.0 36.3  PLT 282 263        COAGS: Recent Labs (within last 365 days)  No results for input(s): INR, APTT in the last 8760 hours.     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  I have explained that this type of aneurysm does not cause subarachnoid hemorrhage.  I have explained that the most common symptom from the aneurysm is double vision as it often first affects the 6 cranial nerve.  I have explained that the aneurysms often cause headaches when they  become very large.  I have explained that there is a risk of bleeding, which causes a condition called a carotid cavernous fistula and that this could be serious.  On the other hand, I have explained that this event is highly unlikely.   I have explained that surgery at this point is optional as the aneurysm will not cause subarachnoid hemorrhage and as such there is no urgency for treatment.  On the other hand, given her young age, I think it is very likely the aneurysm will become symptomatic at some point.   I have explained the risks of the procedure to fix the aneurysm, including the most important complication of stroke, which I estimated around 1%.  I have explained that other symptoms may occur after treating the anxiety including headache and diplopia.   She understands the above and would like to proceed with the treatment.  She would like to have her sterilization procedure first.  This type of aneurysm does not present any contraindication for anesthesia.     Electronically Signed: Nancyann LULLA Burns  07/06/2024, 11:50 AM

## 2024-08-30 NOTE — Sedation Documentation (Signed)
Patient under the care of Anesthesia  

## 2024-08-30 NOTE — Op Note (Signed)
  NEUROSURGERY BRIEF OP NOTE   PREOP DX: giant cavernous aneurysm  POSTOP DX: Same  PROCEDURE: pipeline   SURGEON: Nancyann LULLA Burns   ANESTHESIA: GETA  EBL: 50  COMPLICATIONS: No immediate  CONDITION: Stable  FINDINGS:  Giant right cavernous aneurysm, pipeline  Nancyann LULLA Burns  @today @ 12:49 PM

## 2024-08-30 NOTE — Consult Note (Addendum)
 NAME:  Rachael Espinoza, MRN:  [id number], DOB:  1996-01-02, LOS: 0 ADMISSION DATE:  08/30/2024, CONSULTATION DATE:  08/30/2024 REFERRING MD:  NSGY, CHIEF COMPLAINT:  Medical Management    History of Present Illness:  Rachael Espinoza is a 28 y.o. with a past medical history significant for vasovagal syndrome, Chiari malformation and carotid aneurysm who presented to [facility] 10/20 for elective management of cavernous carotid aneurysm. PCCM consulted for assistance in medical management post op.    Pertinent  Medical History  Vasovagal syndrome, Chiari malformation and carotid aneurysm   Significant Hospital Events: Including procedures, antibiotic start and stop dates in addition to other pertinent events   10/20 presented for elective medical management for cavernous carotid aneurysm  Interim History / Subjective:  Seen sitting up in bed with no acute complaints  Objective    Blood pressure 108/81, pulse 73, temperature 98 F (36.7 C), resp. rate 12, height 5' 3 (1.6 m), weight 59 kg, last menstrual period 08/23/2024, SpO2 100%.        Intake/Output Summary (Last 24 hours) at 08/30/2024 1614 Last data filed at 08/30/2024 1607 Gross per 24 hour  Intake 940 ml  Output 10 ml  Net 930 ml   Filed Weights   08/30/24 0825  Weight: 59 kg    Examination: General: Well-appearing adult female sitting up in bed in no acute distress HEENT: [place]/AT, MM pink/moist, PERRL,  Neuro: Alert and oriented x 3, nonfocal CV: s1s2 regular rate and rhythm, no murmur, rubs, or gallops,  PULM: Clear to auscultation bilaterally, no increased work of breathing, no added breath sounds GI: soft, bowel sounds active in all 4 quadrants, non-tender, non-distended, tolerating oral diet Extremities: warm/dry, no edema  Skin: no rashes or lesions  Resolved problem list   Assessment and Plan  Right cavernous carotid aneurysm - Underwent pipeline embolization with Dr. Lester  10/20 P: Postoperatively patient remained stable with no acute complaints Monitor in the ICU overnight Continuous telemetry Maintain neuro protective measures; goal for eurothermia, euglycemia, eunatermia, normoxia, and PCO2 goal of 35-40 Aspirations precautions   Labs   CBC: No results for input(s): WBC, NEUTROABS, HGB, HCT, MCV, PLT in the last 168 hours.  Basic Metabolic Panel: No results for input(s): NA, K, CL, CO2, GLUCOSE, BUN, CREATININE, CALCIUM , MG, PHOS in the last 168 hours. GFR: Estimated Creatinine Clearance: 81.7 mL/min (by C-G formula based on SCr of 0.84 mg/dL). No results for input(s): PROCALCITON, WBC, LATICACIDVEN in the last 168 hours.  Liver Function Tests: No results for input(s): AST, ALT, ALKPHOS, BILITOT, PROT, ALBUMIN in the last 168 hours. No results for input(s): LIPASE, AMYLASE in the last 168 hours. No results for input(s): AMMONIA in the last 168 hours.  ABG No results found for: PHART, PCO2ART, PO2ART, HCO3, TCO2, ACIDBASEDEF, O2SAT   Coagulation Profile: No results for input(s): INR, PROTIME in the last 168 hours.  Cardiac Enzymes: No results for input(s): CKTOTAL, CKMB, CKMBINDEX, TROPONINI in the last 168 hours.  HbA1C: No results found for: HGBA1C  CBG: No results for input(s): GLUCAP in the last 168 hours.  Review of Systems:   Please see the history of present illness. All other systems reviewed and are negative   Past Medical History:  She,  has a past medical history of ASCUS of cervix with negative high risk HPV (05/10/2022), Chiari malformation, Contraceptive management (08/25/2014), Headache(784.0), Intracranial aneurysm, Pinched nerve, Supervision of normal pregnancy (08/13/2016), and Vasovagal syncope.   Surgical History:   Past  Surgical History:  Procedure Laterality Date   SAPINGECTOMY, ROBOT ASSISTED, LAPAROSCOPIC Bilateral 08/17/2024    Procedure: SAPINGECTOMY, ROBOT ASSISTED, LAPAROSCOPIC, D5;  Surgeon: Rachael Vonn DEL, MD;  Location: [facility];  Service: Gynecology;  Laterality: Bilateral;   WISDOM TOOTH EXTRACTION  2015     Social History:   reports that she has quit smoking. Her smoking use included cigarettes. She has never used smokeless tobacco. She reports current alcohol use. She reports that she does not use drugs.   Family History:  Her family history includes Cancer in her father, maternal aunt, maternal grandfather, and other family members; Heart disease in an other family member; Hypertension in her mother; Other in her maternal grandmother; Thyroid disease in her mother.   Allergies No Known Allergies   Home Medications  Prior to Admission medications   Medication Sig Start Date End Date Taking? Authorizing Provider  aspirin  EC 325 MG tablet Take 1 tablet (325 mg total) by mouth daily. Begin 2 days before surgery and take morning of surgery 08/04/24  Yes Rachael Golas, MD  clopidogrel  (PLAVIX ) 75 MG tablet Take 1 tablet (75 mg total) by mouth daily for 180 doses. Begin 7 days before surgery and take morning of surgery 08/04/24 01/31/25 Yes Rachael Golas, MD  Polyethyl Glycol-Propyl Glycol (LUBRICANT EYE DROPS) 0.4-0.3 % SOLN Place 1-2 drops into both eyes 3 (three) times daily as needed (dry/irritated eyes.).   Yes [provider]  ketorolac (TORADOL) 10 MG tablet Take 1 tablet (10 mg total) by mouth every 8 (eight) hours as needed. Patient not taking: Reported on 08/26/2024 08/17/24   Rachael Vonn DEL, MD  ondansetron  (ZOFRAN -ODT) 8 MG disintegrating tablet Take 1 tablet (8 mg total) by mouth every 8 (eight) hours as needed for nausea or vomiting. Patient not taking: Reported on 08/26/2024 08/17/24   Rachael Vonn DEL, MD  oxyCODONE -acetaminophen  (PERCOCET) 5-325 MG tablet Take 1 tablet by mouth every 6 (six) hours as needed for severe pain (pain score 7-10). Patient not taking: Reported on 08/26/2024 08/17/24    Rachael Vonn DEL, MD     Critical care time: NA  Rachael Nocera D. Harris, NP-C [facility] Personal contact information can be found on Amion  If no contact or response made please call [phone] 08/30/2024, 5:51 PM

## 2024-08-30 NOTE — Progress Notes (Signed)
 No complaints Neuro intact.  Right hand warm with good pulse.  Observe overnight and plan DC tomorrow.

## 2024-08-30 NOTE — Anesthesia Postprocedure Evaluation (Signed)
 Anesthesia Post Note  Patient: Rachael Espinoza  Procedure(s) Performed: RADIOLOGY WITH ANESTHESIA     Patient location during evaluation: PACU Anesthesia Type: General Level of consciousness: awake and alert Pain management: pain level controlled Vital Signs Assessment: post-procedure vital signs reviewed and stable Respiratory status: spontaneous breathing, nonlabored ventilation, respiratory function stable and patient connected to nasal cannula oxygen Cardiovascular status: blood pressure returned to baseline and stable Postop Assessment: no apparent nausea or vomiting Anesthetic complications: no   No notable events documented.  Last Vitals:  Vitals:   08/30/24 1305 08/30/24 1315  BP: 110/63 111/68  Pulse: 88 87  Resp: 20 14  Temp: 36.7 C   SpO2: 100% 100%    Last Pain:  Vitals:   08/30/24 1305  TempSrc:   PainSc: 0-No pain                 Thom JONELLE Peoples

## 2024-08-30 NOTE — Anesthesia Preprocedure Evaluation (Signed)
 Anesthesia Evaluation  Patient identified by MRN, date of birth, ID band Patient awake    Reviewed: Allergy & Precautions, H&P , NPO status , Patient's Chart, lab work & pertinent test results  History of Anesthesia Complications Negative for: history of anesthetic complications  Airway Mallampati: II  TM Distance: >3 FB Neck ROM: Full    Dental no notable dental hx.    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   Neuro/Psych  Headaches, neg Seizures Chiari malformation Vasovagal syncope  negative psych ROS   GI/Hepatic negative GI ROS, Neg liver ROS,,,  Endo/Other  negative endocrine ROS    Renal/GU negative Renal ROS  negative genitourinary   Musculoskeletal negative musculoskeletal ROS (+)    Abdominal   Peds negative pediatric ROS (+)  Hematology negative hematology ROS (+)   Anesthesia Other Findings   Reproductive/Obstetrics negative OB ROS                              Anesthesia Physical Anesthesia Plan  ASA: 3  Anesthesia Plan: General   Post-op Pain Management: Minimal or no pain anticipated   Induction: Intravenous  PONV Risk Score and Plan: 3 and Ondansetron , Dexamethasone, Midazolam and Treatment may vary due to age or medical condition  Airway Management Planned: Oral ETT  Additional Equipment: None  Intra-op Plan:   Post-operative Plan: Extubation in OR  Informed Consent: I have reviewed the patients History and Physical, chart, labs and discussed the procedure including the risks, benefits and alternatives for the proposed anesthesia with the patient or authorized representative who has indicated his/her understanding and acceptance.     Dental  advisory given  Plan Discussed with: CRNA  Anesthesia Plan Comments:          Anesthesia Quick Evaluation

## 2024-08-30 NOTE — Sedation Documentation (Signed)
ACT 250 

## 2024-08-30 NOTE — Discharge Summary (Incomplete)
 Physician Discharge Summary         Patient ID: Rachael Espinoza MRN: [id number] DOB/AGE: 04/24/1996 28 y.o.  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 with a past medical history significant for vasovagal syndrome, Chiari malformation and carotid aneurysm who presented to [facility] 10/20 for elective management of cavernous carotid aneurysm. PCCM consulted for assistance in medical management post op.    Patient was observed in ICU overnight with no acute complications seen.  By a.m. 10/21 she is ambulating per baseline and tolerating PO diet. Therefore, she was discharged home in stable condition.  Discharge Plan by Active Problems    Right cavernous carotid aneurysm Underwent pipeline embolization with Dr. Lester 08/30/2024. Continue home ASA and Plavix . Follow-up in 1 month with Dr. Lester.   Significant Hospital tests/ studies  Peri-procedural imaging  Procedures   Pipeline embolization of cavernous carotid aneurysm 08/30/2024  Culture data/antimicrobials   None   Consults  Neuro IR PCCM  Discharge Exam: BP (!) 86/51   Pulse (!) 57   Temp 98.2 F (36.8 C) (Oral)   Resp 13   Ht 5' 3 (1.6 m)   Wt 59 kg   LMP 08/23/2024 (Exact Date) Comment: 08/12/24 urine preg.negative  SpO2 98%   BMI 23.03 kg/m   Physical exam General: healthy appearing female, no acute distress HEENT: [place]/AT, sclera anicteric, MM pink/moist Neuro: alert and oriented x 4, follows commands throughout with no focal deficits CV: regular rate and rhythm, normal S1 and S2, no m/r/g PULM: breathing comfortably on room air, clear to auscultation GI: soft, non-tender, non-distended Extremities: warm, dry, no edema Skin: no rashes or lesions  Labs at discharge   Lab Results  Component Value Date   CREATININE 0.84 08/12/2024    BUN 8 08/12/2024   NA 137 08/12/2024   K 3.5 08/12/2024   CL 103 08/12/2024   CO2 22 08/12/2024   Lab Results  Component Value Date   WBC 8.3 08/12/2024   HGB 13.2 08/12/2024   HCT 39.0 08/12/2024   MCV 94.2 08/12/2024   PLT 302 08/12/2024   Lab Results  Component Value Date   ALT 22 08/12/2024   AST 20 08/12/2024   ALKPHOS 52 08/12/2024   BILITOT 0.4 08/12/2024   No results found for: INR, PROTIME  Current radiological studies    No results found.  Disposition:     Discharge disposition: 01-Home or Self Care       Discharge Instructions     Call MD for:  difficulty breathing, headache or visual disturbances   Complete by: As directed    Call MD for:  persistant dizziness or light-headedness   Complete by: As directed    Call MD for:  persistant nausea and vomiting   Complete by: As directed    Call MD for:  redness, tenderness, or signs of infection (pain, swelling, redness, odor or green/yellow discharge around incision site)   Complete by: As directed    Call MD for:  severe uncontrolled pain   Complete by: As directed    Call MD for:  temperature >100.4   Complete by: As directed    Diet general   Complete by: As directed    Discharge instructions   Complete by: As directed    Continue taking ASA and Plavix  daily. Follow-up with  Dr. Lester as planned in 1 month.   Increase activity slowly   Complete by: As directed        Allergies as of 08/31/2024   No Known Allergies      Medication List     STOP taking these medications    ketorolac 10 MG tablet Commonly known as: TORADOL   ondansetron  8 MG disintegrating tablet Commonly known as: ZOFRAN -ODT   oxyCODONE -acetaminophen  5-325 MG tablet Commonly known as: Percocet       TAKE these medications    acetaminophen  325 MG tablet Commonly known as: TYLENOL  Take 2 tablets (650 mg total) by mouth every 6 (six) hours as needed for mild pain (pain score 1-3), fever or moderate pain  (pain score 4-6) (or temp > 37.5 C (99.5 F)).   aspirin  EC 325 MG tablet Take 1 tablet (325 mg total) by mouth daily. Begin 2 days before surgery and take morning of surgery   clopidogrel  75 MG tablet Commonly known as: Plavix  Take 1 tablet (75 mg total) by mouth daily for 180 doses. Begin 7 days before surgery and take morning of surgery   Lubricant Eye Drops 0.4-0.3 % Soln Generic drug: Polyethyl Glycol-Propyl Glycol Place 1-2 drops into both eyes 3 (three) times daily as needed (dry/irritated eyes.).         Follow-up appointment   Neurosurgery  Discharge Condition:    stable  Signed: Rexene LOISE Blush 08/31/2024, 10:06 AM

## 2024-08-30 NOTE — Transfer of Care (Signed)
 Immediate Anesthesia Transfer of Care Note  Patient: Rachael Espinoza  Procedure(s) Performed: RADIOLOGY WITH ANESTHESIA  Patient Location: PACU  Anesthesia Type:General  Level of Consciousness: awake, alert , and oriented  Airway & Oxygen Therapy: Patient Spontanous Breathing  Post-op Assessment: Report given to RN, Post -op Vital signs reviewed and stable, and Patient moving all extremities  Post vital signs: Reviewed and stable  Last Vitals:  Vitals Value Taken Time  BP 110/63 08/30/24 13:05  Temp    Pulse 92 08/30/24 13:08  Resp 15 08/30/24 13:08  SpO2 100 % 08/30/24 13:08  Vitals shown include unfiled device data.  Last Pain:  Vitals:   08/30/24 0842  TempSrc:   PainSc: 0-No pain         Complications: No notable events documented.

## 2024-08-30 NOTE — Anesthesia Procedure Notes (Signed)
 Procedure Name: Intubation Date/Time: 08/30/2024 10:33 AM  Performed by: Jerl Donald LABOR, CRNAPre-anesthesia Checklist: Patient identified, Emergency Drugs available, Suction available and Patient being monitored Patient Re-evaluated:Patient Re-evaluated prior to induction Oxygen Delivery Method: Circle System Utilized Preoxygenation: Pre-oxygenation with 100% oxygen Induction Type: IV induction Ventilation: Mask ventilation without difficulty and Oral airway inserted - appropriate to patient size Laryngoscope Size: Mac and 3 Grade View: Grade I Tube type: Oral Tube size: 7.0 mm Number of attempts: 1 Airway Equipment and Method: Stylet and Oral airway Placement Confirmation: ETT inserted through vocal cords under direct vision, positive ETCO2 and breath sounds checked- equal and bilateral Secured at: 22 cm Tube secured with: Tape Dental Injury: Teeth and Oropharynx as per pre-operative assessment

## 2024-08-31 ENCOUNTER — Encounter (HOSPITAL_COMMUNITY): Payer: Self-pay | Admitting: Neuroradiology

## 2024-08-31 ENCOUNTER — Telehealth: Payer: Self-pay | Admitting: Neuroradiology

## 2024-08-31 DIAGNOSIS — I671 Cerebral aneurysm, nonruptured: Secondary | ICD-10-CM | POA: Diagnosis not present

## 2024-08-31 DIAGNOSIS — I720 Aneurysm of carotid artery: Secondary | ICD-10-CM | POA: Diagnosis not present

## 2024-08-31 MED ORDER — ACETAMINOPHEN 325 MG PO TABS: 650.0000 mg | ORAL_TABLET | Freq: Four times a day (QID) | ORAL | 0 refills | Status: AC | PRN

## 2024-08-31 NOTE — Telephone Encounter (Signed)
 She stated she would like a call back today as well

## 2024-08-31 NOTE — Telephone Encounter (Signed)
 Patient called to follow up on paperwork she sent through email to [facility] yesterday, she said she needs filled out this week.

## 2024-08-31 NOTE — Progress Notes (Signed)
 Awake and feels well Alert and oriented with normal speech expression, fluency and comprehension. Visual fields are full to confrontation.   Face is symmetric. Strength in the arms and legs is symmetric with no drift.  Sensation is normal and symmetric. No ataxia. No inattention.   Emphasized the importance of the aspirin  and plavix .  Follow up one month.

## 2024-09-02 NOTE — Telephone Encounter (Signed)
 Left voicemail for patient

## 2024-09-14 NOTE — Telephone Encounter (Signed)
 I called the patient. I clarified with her insurance about her inpatient stay and they told me to tell her to disregard this information.   Patient indicates understanding.

## 2024-09-23 ENCOUNTER — Ambulatory Visit: Admitting: Neuroradiology

## 2024-09-27 ENCOUNTER — Ambulatory Visit (INDEPENDENT_AMBULATORY_CARE_PROVIDER_SITE_OTHER): Admitting: Neuroradiology

## 2024-09-27 ENCOUNTER — Encounter: Payer: Self-pay | Admitting: Neuroradiology

## 2024-09-27 ENCOUNTER — Other Ambulatory Visit: Payer: Self-pay

## 2024-09-27 VITALS — BP 129/73 | HR 66 | Ht 63.0 in | Wt 131.0 lb

## 2024-09-27 DIAGNOSIS — H43399 Other vitreous opacities, unspecified eye: Secondary | ICD-10-CM | POA: Diagnosis not present

## 2024-09-27 DIAGNOSIS — Z95828 Presence of other vascular implants and grafts: Secondary | ICD-10-CM

## 2024-09-27 DIAGNOSIS — I671 Cerebral aneurysm, nonruptured: Secondary | ICD-10-CM

## 2024-09-27 DIAGNOSIS — Z09 Encounter for follow-up examination after completed treatment for conditions other than malignant neoplasm: Secondary | ICD-10-CM

## 2024-09-27 NOTE — Progress Notes (Signed)
 I had the pleasure of seeing Rachael Espinoza today for 1 month follow-up after treatment of a giant right cavernous aneurysm 08/30/2024 with a pipeline device.  She has recovered well.  She notes occasional floaters in her peripheral vision, but no other visual disturbances.  No symptoms of stroke or TIA otherwise.  Her radial artery feels normal.  She is having minor problems with bruising, but otherwise tolerating the aspirin  and the clopidogrel .  Assessment:  Recovering well from aneurysm treatment  Recommendation:  Continue dual antiplatelet therapy 6 months, follow-up arteriogram in April

## 2024-09-30 ENCOUNTER — Ambulatory Visit: Admitting: Neuroradiology

## 2024-11-08 ENCOUNTER — Encounter (INDEPENDENT_AMBULATORY_CARE_PROVIDER_SITE_OTHER): Payer: Self-pay

## 2024-11-30 ENCOUNTER — Telehealth: Payer: Self-pay

## 2024-11-30 NOTE — Telephone Encounter (Addendum)
 Patient called to report symptoms that occurred  on Friday 11/26/2024.   She developed Kaleidoscope like vision. Lasted for then resolved. She has not had any instances since.   She states that she had headache for around 15 mins after this instance that was about a 3-4/10 and felt like a discomfort sensation.   She is calling back to see if she needs follow up before April.

## 2024-12-01 NOTE — Telephone Encounter (Signed)
 Patient was notified. She plans on calling me if anything else happens and we will see her in April.

## 2025-02-16 ENCOUNTER — Ambulatory Visit: Admitting: Neuroradiology
